# Patient Record
Sex: Male | Born: 1975
Health system: Southern US, Community
[De-identification: ages and names within clinical notes are randomized; demographics above are authoritative.]

## PROBLEM LIST (undated history)

## (undated) DIAGNOSIS — E785 Hyperlipidemia, unspecified: Secondary | ICD-10-CM

## (undated) DIAGNOSIS — Z8711 Personal history of peptic ulcer disease: Secondary | ICD-10-CM

## (undated) DIAGNOSIS — K5792 Diverticulitis of intestine, part unspecified, without perforation or abscess without bleeding: Secondary | ICD-10-CM

## (undated) DIAGNOSIS — M722 Plantar fascial fibromatosis: Secondary | ICD-10-CM

## (undated) DIAGNOSIS — Z8719 Personal history of other diseases of the digestive system: Secondary | ICD-10-CM

## (undated) HISTORY — PX: COLONOSCOPY: SHX174

## (undated) HISTORY — DX: Plantar fascial fibromatosis: M72.2

## (undated) HISTORY — PX: APPENDECTOMY: SHX54

## (undated) HISTORY — PX: UPPER GASTROINTESTINAL ENDOSCOPY: SHX188

## (undated) HISTORY — DX: Hyperlipidemia, unspecified: E78.5

## (undated) HISTORY — DX: Personal history of other diseases of the digestive system: Z87.19

## (undated) HISTORY — DX: Diverticulitis of intestine, part unspecified, without perforation or abscess without bleeding: K57.92

## (undated) HISTORY — PX: HERNIA REPAIR: SHX51

## (undated) HISTORY — DX: Personal history of peptic ulcer disease: Z87.11

---

## 2009-08-29 DIAGNOSIS — J302 Other seasonal allergic rhinitis: Secondary | ICD-10-CM | POA: Insufficient documentation

## 2009-08-29 DIAGNOSIS — Z8249 Family history of ischemic heart disease and other diseases of the circulatory system: Secondary | ICD-10-CM | POA: Insufficient documentation

## 2012-03-17 ENCOUNTER — Telehealth: Payer: Self-pay | Admitting: Internal Medicine

## 2012-03-17 ENCOUNTER — Emergency Department (HOSPITAL_COMMUNITY)
Admission: EM | Admit: 2012-03-17 | Discharge: 2012-03-17 | Disposition: A | Discharge status: Home / Self Care | Payer: BC Managed Care – PPO | Attending: Emergency Medicine | Admitting: Emergency Medicine

## 2012-03-17 ENCOUNTER — Encounter (HOSPITAL_COMMUNITY): Payer: Self-pay | Admitting: *Deleted

## 2012-03-17 DIAGNOSIS — K922 Gastrointestinal hemorrhage, unspecified: Secondary | ICD-10-CM

## 2012-03-17 DIAGNOSIS — Z79899 Other long term (current) drug therapy: Secondary | ICD-10-CM | POA: Insufficient documentation

## 2012-03-17 DIAGNOSIS — K921 Melena: Secondary | ICD-10-CM | POA: Insufficient documentation

## 2012-03-17 LAB — DIFFERENTIAL
Eosinophils Relative: 4 % (ref 0–5)
Lymphocytes Relative: 25 % (ref 12–46)
Lymphs Abs: 1.9 10*3/uL (ref 0.7–4.0)
Monocytes Absolute: 0.6 10*3/uL (ref 0.1–1.0)
Monocytes Relative: 8 % (ref 3–12)
Neutro Abs: 4.7 10*3/uL (ref 1.7–7.7)

## 2012-03-17 LAB — BASIC METABOLIC PANEL
CO2: 26 mEq/L (ref 19–32)
Chloride: 103 mEq/L (ref 96–112)
GFR calc Af Amer: >90 mL/min (ref >90)
Potassium: 4.2 mEq/L (ref 3.5–5.1)

## 2012-03-17 LAB — CBC
HCT: 36.1 % — ABNORMAL LOW (ref 39.0–52.0)
Hemoglobin: 12.4 g/dL — ABNORMAL LOW (ref 13.0–17.0)
MCV: 86.8 fL (ref 78.0–100.0)
WBC: 7.5 10*3/uL (ref 4.0–10.5)

## 2012-03-17 LAB — OCCULT BLOOD, POC DEVICE: Fecal Occult Bld: POSITIVE

## 2012-03-17 MED ORDER — SODIUM CHLORIDE 0.9 % IV SOLN
80.0000 mg | INTRAVENOUS | Status: AC
  Administered 2012-03-17: 80 mg via INTRAVENOUS
  Filled 2012-03-17: qty 80

## 2012-03-17 MED ORDER — PANTOPRAZOLE SODIUM 20 MG PO TBEC: 20.0000 mg | DELAYED_RELEASE_TABLET | Freq: Two times a day (BID) | ORAL | Status: DC

## 2012-03-17 MED ORDER — SODIUM CHLORIDE 0.9 % IV BOLUS (SEPSIS)
1000.0000 mL | INTRAVENOUS | Status: AC
  Administered 2012-03-17: 1000 mL via INTRAVENOUS

## 2012-03-17 NOTE — ED Provider Notes (Signed)
History     CSN: [id number]  Arrival date & time 03/17/12  1101   None     Chief Complaint  Patient presents with  . Rectal Bleeding    (Consider location/radiation/quality/duration/timing/severity/associated sxs/prior treatment) Patient is a 36 y.o. male presenting with hematochezia. The history is provided by the patient.  Rectal Bleeding  The current episode started yesterday. Episode frequency: twice. The problem has been unchanged. The patient is experiencing no pain. The stool is described as soft (dark appearing). There was no prior successful therapy. There was no prior unsuccessful therapy. Pertinent negatives include no fever, no abdominal pain, no diarrhea, no nausea, no rectal pain, no vomiting, no hematuria, no chest pain, no headaches, no coughing and no difficulty breathing. He has been behaving normally. He has been eating and drinking normally. Urine output has been normal. There were no sick contacts.    History reviewed. No pertinent past medical history.  Past Surgical History  Procedure Date  . Hernia repair     History reviewed. No pertinent family history.  History  Substance Use Topics  . Smoking status: Never Smoker   . Smokeless tobacco: Not on file  . Alcohol Use: 1.2 oz/week    2 Shots of liquor per week     occ      Review of Systems  Constitutional: Negative for fever.  HENT: Negative for rhinorrhea, drooling and neck pain.   Eyes: Negative for pain.  Respiratory: Negative for cough and shortness of breath.   Cardiovascular: Negative for chest pain and leg swelling.  Gastrointestinal: Positive for hematochezia. Negative for nausea, vomiting, abdominal pain, diarrhea and rectal pain.  Genitourinary: Negative for dysuria and hematuria.  Musculoskeletal: Negative for gait problem.  Skin: Negative for color change.  Neurological: Negative for numbness and headaches.  Hematological: Negative for adenopathy.  Psychiatric/Behavioral: Negative  for behavioral problems.  All other systems reviewed and are negative.    Allergies  Review of patient's allergies indicates no known allergies.  Home Medications   Current Outpatient Rx  Name Route Sig Dispense Refill  . CETIRIZINE HCL 10 MG PO TABS Oral Take 10 mg by mouth daily as needed. For allergies      BP 123/88  Pulse 91  Temp(Src) 98.1 F (36.7 C) (Oral)  Resp 18  SpO2 100%  Physical Exam  Constitutional: He is oriented to person, place, and time. He appears well-developed and well-nourished.  HENT:  Head: Normocephalic and atraumatic.  Right Ear: External ear normal.  Left Ear: External ear normal.  Nose: Nose normal.  Mouth/Throat: Oropharynx is clear and moist. No oropharyngeal exudate.  Eyes: Conjunctivae and EOM are normal. Pupils are equal, round, and reactive to light.  Neck: Normal range of motion. Neck supple.  Cardiovascular: Normal rate, regular rhythm, normal heart sounds and intact distal pulses.  Exam reveals no gallop and no friction rub.   No murmur heard. Pulmonary/Chest: Effort normal and breath sounds normal. No respiratory distress. He has no wheezes.  Abdominal: Soft. Bowel sounds are normal. He exhibits no distension. There is no tenderness.  Genitourinary: Guaiac positive stool (positive heme occult, dark melanotic stool, no gross blood).  Musculoskeletal: Normal range of motion. He exhibits no edema and no tenderness.  Neurological: He is alert and oriented to person, place, and time.  Skin: Skin is warm and dry.  Psychiatric: He has a normal mood and affect. His behavior is normal.    ED Course  Procedures (including critical care time)  Labs Reviewed  OCCULT BLOOD, POC DEVICE   No results found.   No diagnosis found.    MDM  1:19 PM 36 y.o. male pw dark stool that occurred last night and this morning. Pt also notes mild dizziness this am. Pt AFVSS here, he appears well on exam, he denies any pain. Melanotic stool on rectal  exam, no gross blood. Will get labs.  Hgb 12.4, pt denies sx. Discussed case w/ Dr. Margarette Asal of [facility]. I have scheduled an appt for him in 2 days for f/u at [facility] clinic. Will give IV protonix here and start on po protonix at home. Will give 1L IVF as well.    I have discussed the diagnosis/risks/treatment options with the patient and family and believe the pt to be eligible for discharge home to follow-up with Gunnar Fusi NP, at [facility] in 2 days. We also discussed returning to the ED immediately if new or worsening sx occur. We discussed the sx which are most concerning (e.g., further dizziness, worsening of dark stools, grossly bloody stools) that necessitate immediate return. Any new prescriptions provided to the patient are listed below.  New Prescriptions   No medications on file   Clinical Impression 1. GI bleed           Purvis Sheffield, MD 03/17/12 1714

## 2012-03-17 NOTE — Discharge Instructions (Signed)
Gastrointestinal Bleeding  Bleeding in the gastrointestinal tract comes out when you throw up (vomit) or poop. Treatment will depend on how fast the blood is flowing, where it is coming from, and the cause. A small amount of bleeding that stops on its own may not need treatment.   HOME CARE   Do not drink alcohol.   Do not eat things that upset your stomach or give you heartburn.   Rest and limit your activity.   Do not smoke. Smoking may make your problems worse.   Wash your hands or use sanitizer every time you use the bathroom. Some bleeding is caused by germs.   Only take medicine as told by your doctor.  GET HELP RIGHT AWAY IF:    Your throw up looks like coffee grounds or is dark or bright red.   Your poop is black or tarry. You see blood in the toilet.   You feel weak, dizzy, and short of breath.   You breathe fast and have a fast heartbeat.   You have bad stomach pain or cramping.  MAKE SURE YOU:    Understand these instructions.   Will watch your condition.   Will get help right away if you are not doing well or get worse.  Document Released: 07/16/2008 Document Revised: 09/26/2011 Document Reviewed: 09/16/2011  Exit[employer] Patient Information 2012 [employer].

## 2012-03-17 NOTE — ED Notes (Signed)
Pt undressed, in gown, on continuous pulse oximetry and blood pressure cuff; wife at bedisde

## 2012-03-17 NOTE — ED Notes (Signed)
Pt states after dinner started to feel different and he had some black color stools.  This am woke up feeling dizzy and lightheaded and reports blood in toilet and dark stools

## 2012-03-17 NOTE — ED Notes (Signed)
Pt on laptop and wife at bedside; pt has no needs at this time

## 2012-03-17 NOTE — Telephone Encounter (Signed)
Pt given an appt with Willette Cluster, NP on 03/19/12.

## 2012-03-17 NOTE — ED Provider Notes (Signed)
Melena since last evening feels fine otherwise.  I saw and evaluated the patient, reviewed the resident's note and I agree with the findings and plan.  Hurman Horn, MD 03/18/12 [phone]

## 2012-03-19 ENCOUNTER — Other Ambulatory Visit (INDEPENDENT_AMBULATORY_CARE_PROVIDER_SITE_OTHER): Payer: BC Managed Care – PPO

## 2012-03-19 ENCOUNTER — Ambulatory Visit (INDEPENDENT_AMBULATORY_CARE_PROVIDER_SITE_OTHER): Payer: BC Managed Care – PPO | Admitting: Nurse Practitioner

## 2012-03-19 ENCOUNTER — Encounter: Payer: Self-pay | Admitting: Nurse Practitioner

## 2012-03-19 VITALS — BP 118/82 | HR 72 | Ht 73.0 in | Wt 210.2 lb

## 2012-03-19 DIAGNOSIS — K921 Melena: Secondary | ICD-10-CM

## 2012-03-19 DIAGNOSIS — R195 Other fecal abnormalities: Secondary | ICD-10-CM

## 2012-03-19 DIAGNOSIS — K625 Hemorrhage of anus and rectum: Secondary | ICD-10-CM

## 2012-03-19 DIAGNOSIS — K922 Gastrointestinal hemorrhage, unspecified: Secondary | ICD-10-CM

## 2012-03-19 LAB — CBC WITH DIFFERENTIAL/PLATELET
Basophils Absolute: 0 10*3/uL (ref 0.0–0.1)
HCT: 32.7 % — ABNORMAL LOW (ref 39.0–52.0)
Hemoglobin: 10.9 g/dL — ABNORMAL LOW (ref 13.0–17.0)
Lymphs Abs: 1.9 10*3/uL (ref 0.7–4.0)
MCHC: 33.4 g/dL (ref 30.0–36.0)
MCV: 91.4 fl (ref 78.0–100.0)
Monocytes Absolute: 0.4 10*3/uL (ref 0.1–1.0)
Monocytes Relative: 7 % (ref 3.0–12.0)
Neutro Abs: 3.2 10*3/uL (ref 1.4–7.7)
Platelets: 230 10*3/uL (ref 150.0–400.0)
RDW: 13.4 % (ref 11.5–14.6)

## 2012-03-20 ENCOUNTER — Ambulatory Visit (AMBULATORY_SURGERY_CENTER): Payer: BC Managed Care – PPO | Admitting: Gastroenterology

## 2012-03-20 ENCOUNTER — Encounter: Payer: Self-pay | Admitting: Gastroenterology

## 2012-03-20 VITALS — BP 121/70 | HR 80 | Temp 97.7°F | Resp 18 | Ht 72.0 in | Wt 210.0 lb

## 2012-03-20 DIAGNOSIS — K250 Acute gastric ulcer with hemorrhage: Secondary | ICD-10-CM

## 2012-03-20 DIAGNOSIS — K922 Gastrointestinal hemorrhage, unspecified: Secondary | ICD-10-CM

## 2012-03-20 DIAGNOSIS — K2990 Gastroduodenitis, unspecified, without bleeding: Secondary | ICD-10-CM

## 2012-03-20 DIAGNOSIS — K625 Hemorrhage of anus and rectum: Secondary | ICD-10-CM

## 2012-03-20 DIAGNOSIS — D62 Acute posthemorrhagic anemia: Secondary | ICD-10-CM

## 2012-03-20 DIAGNOSIS — K2970 Gastritis, unspecified, without bleeding: Secondary | ICD-10-CM

## 2012-03-20 DIAGNOSIS — R195 Other fecal abnormalities: Secondary | ICD-10-CM

## 2012-03-20 MED ORDER — PANTOPRAZOLE SODIUM 20 MG PO TBEC: 20.0000 mg | DELAYED_RELEASE_TABLET | Freq: Every day | ORAL | Status: DC

## 2012-03-20 MED ORDER — SODIUM CHLORIDE 0.9 % IV SOLN: 500.0000 mL | INTRAVENOUS | Status: DC

## 2012-03-20 NOTE — Progress Notes (Signed)
Reviewed and agree with management. Samya Siciliano D. Bijan Ridgley, M.D., FACG  

## 2012-03-20 NOTE — Progress Notes (Signed)
Patient did not have preoperative order for IV antibiotic SSI prophylaxis. (G8918)  Patient did not experience any of the following events: a burn prior to discharge; a fall within the facility; wrong site/side/patient/procedure/implant event; or a hospital transfer or hospital admission upon discharge from the facility. (G8907)  

## 2012-03-20 NOTE — Progress Notes (Signed)
03/20/2012 Larry Pratt [id number] Sep 07, 1976   HISTORY OF PRESENT ILLNESS: Patient is a 36 year old male in great health here for evaluation of black stool. On Monday evening patient began to feel a little out of the ordinary though he could not pinpoint what was exactly wrong. Then he later passed a black stool. He lpassed another black stool the following day. Patient was evlauted in the ED where his BUN was mildly elevated. He was mildy anemic with hemoglobin of 12.7. Stool was heme positive. Since ED visit patient had a third melenic stool yesterday. He is slightly lightheaded at times but overall feels okay. No associated abdominal pain. No nausea. Patient doesn't take NSAIDs on a regular basis. No GERD symptoms. The ED started him on Protonix.  History reviewed. No pertinent past medical history. Past Surgical History  Procedure Date  . Hernia repair     reports that he has never smoked. He quit smokeless tobacco use about 15 years ago. His smokeless tobacco use included Chew. He reports that he drinks about 1.2 ounces of alcohol per week. He reports that he does not use illicit drugs. family history includes Cardiomyopathy in his father and Lung cancer in his paternal grandmother. No Known Allergies    Outpatient Encounter Prescriptions as of 03/19/2012  Medication Sig Dispense Refill  . cetirizine (ZYRTEC) 10 MG tablet Take 10 mg by mouth daily as needed. For allergies      . pantoprazole (PROTONIX) 20 MG tablet Take 1 tablet (20 mg total) by mouth 2 (two) times daily.  20 tablet  0     REVIEW OF SYSTEMS  : All other systems reviewed and negative except where noted in the History of Present Illness.   PHYSICAL EXAM: BP 118/82  Pulse 72  Ht 6\' 1"  (1.854 m)  Wt 210 lb 3.2 oz (95.346 kg)  BMI 27.73 kg/m2  SpO2 98% General: Well developed white male in no acute distress Head: Normocephalic and atraumatic Eyes:  sclerae anicteric,conjunctive pink. Ears: Normal auditory  acuity Neck: Supple, no masses.  Lungs: Clear throughout to auscultation Heart: Regular rate and rhythm; no murmurs heard Abdomen: Soft, non distended, nontender. No masses or hepatomegaly noted. Normal Bowel sounds Rectal: soft, black heme positive stool in vault Musculoskeletal: Symmetrical with no gross deformities  Skin: No lesions on visible extremities Extremities: No edema or deformities noted Neurological: Alert oriented , grossly nonfocal Cervical Nodes:  No significant cervical adenopathy Psychological:  Alert and cooperative. Normal mood and affect  ASSESSMENT AND PLAN:  Slow UGI bleed with melenic stool, mild anemia. Patient hemodynamically stable. Continue BID PPI. He needs EGD as soon as possible for further evaluation. His primary GI physcian will be Dr. Arlyce Dice but Dr. Russella Dar has availability to do the procedure as soon as 8:30am tomorrow. The benefits, risks, and potential complications of EGD with possible biopsies were discussed with the patient and he agrees to proceed. In the meantime if bleeding increases and/or patient becomes SOB or progressive lightheaded he will go back to ED.

## 2012-03-20 NOTE — Patient Instructions (Signed)
YOU HAD AN ENDOSCOPIC PROCEDURE TODAY AT [facility]: Refer to the procedure report that was given to you for any specific questions about what was found during the examination.  If the procedure report does not answer your questions, please call your gastroenterologist to clarify.  If you requested that your care partner not be given the details of your procedure findings, then the procedure report has been included in a sealed envelope for you to review at your convenience later.  YOU SHOULD EXPECT: Some feelings of bloating in the abdomen. Passage of more gas than usual.  Walking can help get rid of the air that was put into your GI tract during the procedure and reduce the bloating. If you had a lower endoscopy (such as a colonoscopy or flexible sigmoidoscopy) you may notice spotting of blood in your stool or on the toilet paper. If you underwent a bowel prep for your procedure, then you may not have a normal bowel movement for a few days.  DIET: Your first meal following the procedure should be a light meal and then it is ok to progress to your normal diet.  A half-sandwich or bowl of soup is an example of a good first meal.  Heavy or fried foods are harder to digest and may make you feel nauseous or bloated.  Likewise meals heavy in dairy and vegetables can cause extra gas to form and this can also increase the bloating.  Drink plenty of fluids but you should avoid alcoholic beverages for 24 hours.  ACTIVITY: Your care partner should take you home directly after the procedure.  You should plan to take it easy, moving slowly for the rest of the day.  You can resume normal activity the day after the procedure however you should NOT DRIVE or use heavy machinery for 24 hours (because of the sedation medicines used during the test).    SYMPTOMS TO REPORT IMMEDIATELY: A gastroenterologist can be reached at any hour.  During normal business hours, 8:30 AM to 5:00 PM Monday through Friday,  call [phone].  After hours and on weekends, please call the GI answering service at [phone] who will take a message and have the physician on call contact you. r  Following upper endoscopy (EGD)  Vomiting of blood or coffee ground material  New chest pain or pain under the shoulder blades  Painful or persistently difficult swallowing  New shortness of breath  Fever of 100F or higher  Black, tarry-looking stools  FOLLOW UP: If any biopsies were taken you will be contacted by phone or by letter within the next 1-3 weeks.  Call your gastroenterologist if you have not heard about the biopsies in 3 weeks.  Our staff will call the home number listed on your records the next business day following your procedure to check on you and address any questions or concerns that you may have at that time regarding the information given to you following your procedure. This is a courtesy call and so if there is no answer at the home number and we have not heard from you through the emergency physician on call, we will assume that you have returned to your regular daily activities without incident.  SIGNATURES/CONFIDENTIALITY: You and/or your care partner have signed paperwork which will be entered into your electronic medical record.  These signatures attest to the fact that that the information above on your After Visit Summary has been reviewed and is understood.  Full responsibility  of the confidentiality of this discharge information lies with you and/or your care-partner.

## 2012-03-20 NOTE — Op Note (Signed)
[facility] [address] [place], [place]  [address]  ENDOSCOPY PROCEDURE REPORT  PATIENT:  Larry Pratt, Larry Pratt  MR#:  [id number] BIRTHDATE:  August 25, 1976, 36 yrs. old  GENDER:  male ENDOSCOPIST:  Judie Petit T. Russella Dar, MD, [facility] Referred by:  Chilton Greathouse, M.D. PROCEDURE DATE:  03/20/2012 PROCEDURE:  EGD with biopsy, 43239 ASA CLASS:  Class I INDICATIONS:  melena, heme + stool, anemia MEDICATIONS:    These medications were titrated to patient response per physician's verbal order, Fentanyl 75 mcg IV, Versed 8 mg IV TOPICAL ANESTHETIC:  Cetacaine Spray DESCRIPTION OF PROCEDURE:   After the risks benefits and alternatives of the procedure were thoroughly explained, informed consent was obtained.  The LB GIF-H180 [id number] endoscope was introduced through the mouth and advanced to the second portion of the duodenum, without limitations.  The instrument was slowly withdrawn as the mucosa was fully examined. <<PROCEDUREIMAGES>> Four ulcers were found in the antrum. They were clean based and benign appearing, 5-7 mm. Multiple biopsies were obtained and sent to pathology.  Multiple erosions were found in the antrum. Otherwise normal stomach.  The esophagus and gastroesophageal junction were completely normal in appearance.  The duodenal bulb was normal in appearance, as was the postbulbar duodenum. Retroflexed views revealed no abnormalities.    The scope was then withdrawn from the patient and the procedure completed.  COMPLICATIONS:  None  ENDOSCOPIC IMPRESSION: 1) Ulcers, four in the antrum 2) Erosions, multiple in the antrum  RECOMMENDATIONS: 1) Await pathology results 2) Avoid ASA/NSAIDs 3) Call to scheule a follow-up appointment in 4-6 weeks 4) PPI bid for 4 weeks, then PPI qam 5) EGD in 8-10 weeks  Norlene Lanes T. Russella Dar, MD, [facility]  n. eSIGNED:   Venita Lick. Brendan Gadson at 03/20/2012 08:50 AM  Theodoro Kos, [id number]

## 2012-03-23 ENCOUNTER — Telehealth: Payer: Self-pay | Admitting: *Deleted

## 2012-03-23 NOTE — Telephone Encounter (Signed)
  Follow up Call-  Call back number 03/20/2012  Post procedure Call Back phone  # [phone]  Permission to leave phone message Yes     Patient questions:  Do you have a fever, pain , or abdominal swelling? no Pain Score  0 *  Have you tolerated food without any problems? yes  Have you been able to return to your normal activities? yes  Do you have any questions about your discharge instructions: Diet   no Medications  no Follow up visit  no  Do you have questions or concerns about your Care? no  Actions: * If pain score is 4 or above: No action needed, pain <4.

## 2012-03-24 ENCOUNTER — Encounter: Payer: Self-pay | Admitting: Gastroenterology

## 2012-05-04 ENCOUNTER — Ambulatory Visit: Payer: BC Managed Care – PPO | Admitting: Gastroenterology

## 2012-05-13 ENCOUNTER — Encounter: Payer: Self-pay | Admitting: Gastroenterology

## 2012-05-13 ENCOUNTER — Other Ambulatory Visit: Payer: Self-pay | Admitting: Gastroenterology

## 2012-05-13 ENCOUNTER — Other Ambulatory Visit (INDEPENDENT_AMBULATORY_CARE_PROVIDER_SITE_OTHER): Payer: BC Managed Care – PPO

## 2012-05-13 ENCOUNTER — Ambulatory Visit (INDEPENDENT_AMBULATORY_CARE_PROVIDER_SITE_OTHER): Payer: BC Managed Care – PPO | Admitting: Gastroenterology

## 2012-05-13 VITALS — BP 90/64 | HR 72 | Ht 73.0 in | Wt 208.1 lb

## 2012-05-13 DIAGNOSIS — D649 Anemia, unspecified: Secondary | ICD-10-CM

## 2012-05-13 DIAGNOSIS — D62 Acute posthemorrhagic anemia: Secondary | ICD-10-CM

## 2012-05-13 DIAGNOSIS — K253 Acute gastric ulcer without hemorrhage or perforation: Secondary | ICD-10-CM

## 2012-05-13 LAB — CBC WITH DIFFERENTIAL/PLATELET
Basophils Absolute: 0 10*3/uL (ref 0.0–0.1)
HCT: 37 % — ABNORMAL LOW (ref 39.0–52.0)
Lymphs Abs: 1.4 10*3/uL (ref 0.7–4.0)
Monocytes Absolute: 0.4 10*3/uL (ref 0.1–1.0)
Monocytes Relative: 7.9 % (ref 3.0–12.0)
Neutrophils Relative %: 60.8 % (ref 43.0–77.0)
Platelets: 271 10*3/uL (ref 150.0–400.0)
RDW: 13.4 % (ref 11.5–14.6)

## 2012-05-13 MED ORDER — PANTOPRAZOLE SODIUM 40 MG PO TBEC: 40.0000 mg | DELAYED_RELEASE_TABLET | Freq: Every day | ORAL | Status: DC

## 2012-05-13 NOTE — Progress Notes (Signed)
History of Present Illness: This is a 36 year old male who returns for followup of bleeding gastric ulcers. He states his exercise tolerance has increased and he feels it is back to normal. He has noted no evidence of bleeding, abdominal pain or any dyspeptic symptoms. Upper endoscopy performed on May 31 revealed: Ulcers, four in the antrum and erosions, multiple in the antrum. Gastric biopsies revealed chronic inactive gastritis with no evidence of H. pylori infection.  Current Medications, Allergies, Past Medical History, Past Surgical History, Family History and Social History were reviewed in [employer] record.  Physical Exam: General: Well developed , well nourished, no acute distress Head: Normocephalic and atraumatic Eyes:  sclerae anicteric, EOMI Ears: Normal auditory acuity Mouth: No deformity or lesions Lungs: Clear throughout to auscultation Heart: Regular rate and rhythm; no murmurs, rubs or bruits Abdomen: Soft, non tender and non distended. No masses, hepatosplenomegaly or hernias noted. Normal Bowel sounds Musculoskeletal: Symmetrical with no gross deformities  Pulses:  Normal pulses noted Extremities: No clubbing, cyanosis, edema or deformities noted Neurological: Alert oriented x 4, grossly nonfocal Psychological:  Alert and cooperative. Normal mood and affect  Assessment and Recommendations:  1. Bleeding gastric ulcers. Advised to minimize or avoid aspirin and NSAIDs long-term. Change pantoprazole to 40 mg daily for ulcer healing and then long-term use for ulcer prevention. 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   2. Anemia secondary to blood loss. Repeat CBC today.

## 2012-05-13 NOTE — Patient Instructions (Addendum)
Your physician has requested that you go to the basement for the following lab work before leaving today: CBC. Take 2 tablets of your pantoprazole once daily and a new prescription for 40 mg tablets have been sent to your pharmacy.  Call back to schedule your Upper Endoscopy.  cc: Chilton Greathouse, MD

## 2012-05-18 ENCOUNTER — Ambulatory Visit: Payer: BC Managed Care – PPO | Admitting: Gastroenterology

## 2012-06-15 ENCOUNTER — Telehealth: Payer: Self-pay | Admitting: Gastroenterology

## 2012-06-15 NOTE — Telephone Encounter (Signed)
I called and spoke with the patient and he is advised that he will need pre-visit.  He is scheduled for pre-visit 06/30/12

## 2012-06-30 ENCOUNTER — Encounter: Payer: Self-pay | Admitting: Gastroenterology

## 2012-06-30 ENCOUNTER — Ambulatory Visit (AMBULATORY_SURGERY_CENTER): Payer: BC Managed Care – PPO

## 2012-06-30 VITALS — Ht 73.0 in | Wt 212.7 lb

## 2012-06-30 DIAGNOSIS — Z09 Encounter for follow-up examination after completed treatment for conditions other than malignant neoplasm: Secondary | ICD-10-CM

## 2012-06-30 DIAGNOSIS — Z8719 Personal history of other diseases of the digestive system: Secondary | ICD-10-CM

## 2012-07-07 ENCOUNTER — Encounter: Payer: Self-pay | Admitting: Gastroenterology

## 2012-07-07 ENCOUNTER — Other Ambulatory Visit (INDEPENDENT_AMBULATORY_CARE_PROVIDER_SITE_OTHER): Payer: BC Managed Care – PPO

## 2012-07-07 ENCOUNTER — Ambulatory Visit (AMBULATORY_SURGERY_CENTER): Payer: BC Managed Care – PPO | Admitting: Gastroenterology

## 2012-07-07 VITALS — BP 125/85 | HR 58 | Temp 96.8°F | Resp 24 | Ht 73.0 in | Wt 208.0 lb

## 2012-07-07 DIAGNOSIS — D490 Neoplasm of unspecified behavior of digestive system: Secondary | ICD-10-CM

## 2012-07-07 DIAGNOSIS — Z8719 Personal history of other diseases of the digestive system: Secondary | ICD-10-CM

## 2012-07-07 DIAGNOSIS — K259 Gastric ulcer, unspecified as acute or chronic, without hemorrhage or perforation: Secondary | ICD-10-CM

## 2012-07-07 DIAGNOSIS — D130 Benign neoplasm of esophagus: Secondary | ICD-10-CM

## 2012-07-07 DIAGNOSIS — D649 Anemia, unspecified: Secondary | ICD-10-CM

## 2012-07-07 DIAGNOSIS — Z09 Encounter for follow-up examination after completed treatment for conditions other than malignant neoplasm: Secondary | ICD-10-CM

## 2012-07-07 LAB — CBC WITH DIFFERENTIAL/PLATELET
Eosinophils Relative: 3.3 % (ref 0.0–5.0)
HCT: 43.6 % (ref 39.0–52.0)
Monocytes Relative: 7.8 % (ref 3.0–12.0)
Neutrophils Relative %: 59.8 % (ref 43.0–77.0)
Platelets: 252 10*3/uL (ref 150.0–400.0)
RBC: 5.23 Mil/uL (ref 4.22–5.81)
WBC: 6.3 10*3/uL (ref 4.5–10.5)

## 2012-07-07 MED ORDER — PANTOPRAZOLE SODIUM 40 MG PO TBEC: 40.0000 mg | DELAYED_RELEASE_TABLET | Freq: Every day | ORAL | Status: DC

## 2012-07-07 MED ORDER — SODIUM CHLORIDE 0.9 % IV SOLN: 500.0000 mL | INTRAVENOUS | Status: DC

## 2012-07-07 NOTE — Progress Notes (Signed)
Patient did not experience any of the following events: a burn prior to discharge; a fall within the facility; wrong site/side/patient/procedure/implant event; or a hospital transfer or hospital admission upon discharge from the facility. (G8907) Patient did not have preoperative order for IV antibiotic SSI prophylaxis. (G8918)  

## 2012-07-07 NOTE — Progress Notes (Signed)
The pt tolerated the egd very well. Maw   

## 2012-07-07 NOTE — Op Note (Signed)
[facility] [address] [place], [address]   ENDOSCOPY PROCEDURE REPORT  PATIENT: Larry Pratt, Larry Pratt  MR#: [id number] BIRTHDATE: 1975/11/03 , 36  yrs. old GENDER: Male ENDOSCOPIST: Meryl Dare, MD, [facility] REFERRED BY:  Chilton Greathouse, M.D. PROCEDURE DATE:  07/07/2012 PROCEDURE:  EGD w/ biopsy ASA CLASS:     Class II INDICATIONS:  Follow up of bleeding gastric ulcers. MEDICATIONS: MAC sedation, administered by CRNA and propofol (Diprivan) 220mg  IV TOPICAL ANESTHETIC: Cetacaine Spray  DESCRIPTION OF PROCEDURE: After the risks benefits and alternatives of the procedure were thoroughly explained, informed consent was obtained.  The LB GIF-H180 [id number] endoscope was introduced through the mouth and advanced to the second portion of the duodenum. Without limitations.  The instrument was slowly withdrawn as the mucosa was fully examined.   ESOPHAGUS: A sessile nodule measuring 3 mm in size was found in the esophagus at 35 cm from incisors.  A biopsy was obtained that completely removed the nodule. The esophagus was otherwise normal.  STOMACH: Small erosion and patchy erythema was found in the gastric antrum. All ulcers had healed. The stomach otherwise appeared normal. DUODENUM: The duodenal mucosa showed no abnormalities in the bulb and second portion of the duodenum.  Retroflexed views revealed no abnormalities.     The scope was then withdrawn from the patient and the procedure completed.  COMPLICATIONS: There were no complications. ENDOSCOPIC IMPRESSION: 1.   Esophageal nodule 2.   Erosion and erythema in the gastric antrum  RECOMMENDATIONS: 1.  Await pathology results 2.  Avoid/minimize ASA/NSAIDs 3.  Continue PPI: pantoprazole 40 mg po qam with refills for 1 year   eSigned:  Meryl Dare, MD, [facility] 07/07/2012 2:43 PM   OZ:[id number] Avva, MD

## 2012-07-07 NOTE — Patient Instructions (Addendum)

## 2012-07-08 ENCOUNTER — Telehealth: Payer: Self-pay

## 2012-07-08 NOTE — Telephone Encounter (Signed)
I left a message on the pt's answering machine to please call us back if he has any questions or concerns. Maw

## 2012-07-13 ENCOUNTER — Encounter: Payer: Self-pay | Admitting: Gastroenterology

## 2012-11-19 DIAGNOSIS — E785 Hyperlipidemia, unspecified: Secondary | ICD-10-CM | POA: Insufficient documentation

## 2012-11-19 DIAGNOSIS — K279 Peptic ulcer, site unspecified, unspecified as acute or chronic, without hemorrhage or perforation: Secondary | ICD-10-CM | POA: Insufficient documentation

## 2015-12-22 DIAGNOSIS — Z0000 Encounter for general adult medical examination without abnormal findings: Secondary | ICD-10-CM | POA: Insufficient documentation

## 2015-12-29 DIAGNOSIS — M722 Plantar fascial fibromatosis: Secondary | ICD-10-CM | POA: Insufficient documentation

## 2016-03-14 ENCOUNTER — Ambulatory Visit (INDEPENDENT_AMBULATORY_CARE_PROVIDER_SITE_OTHER): Payer: 59

## 2016-03-14 ENCOUNTER — Encounter: Payer: Self-pay | Admitting: Podiatry

## 2016-03-14 ENCOUNTER — Ambulatory Visit (INDEPENDENT_AMBULATORY_CARE_PROVIDER_SITE_OTHER): Payer: 59 | Admitting: Podiatry

## 2016-03-14 VITALS — BP 118/82 | HR 64 | Resp 16

## 2016-03-14 DIAGNOSIS — M722 Plantar fascial fibromatosis: Secondary | ICD-10-CM

## 2016-03-14 MED ORDER — METHYLPREDNISOLONE 4 MG PO TBPK: ORAL_TABLET | ORAL | Status: DC

## 2016-03-14 NOTE — Progress Notes (Signed)
   Subjective:    Patient ID: Larry Pratt, male    DOB: 02/02/1976, 40 y.o.   MRN: [id number]  HPI: He presents today with chief complaint of pain to the right heel. He states that he has had pain to the heel for approximately 1 year. He also had pain to the left foot and he performed stretching exercises provided by his primary care provider which alleviated the pain to the left foot. 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 He does relate hiking boots felt much better.    Review of Systems  All other systems reviewed and are negative.      Objective:   Physical Exam: Vital signs are stable alert and oriented 3. Pulses are palpable. Very pleasant well-nourished we'll spoken individual in no apparent distress. Vital signs are stable alert and oriented 3 pulses are palpable. Neurologic sensorium is intact per Semmes-Weinstein monofilament. Deep tendon reflexes are intact bilateral and muscle strength is normal bilateral. Orthopedic evaluation and straight on palpation medial calcaneal tubercle of the right heel no pain on palpation of the left heel. Radiographs do demonstrate soft tissue increase in density at the plantar fascia calcaneal insertion site of the right heel. Cutaneous evaluation demonstrates supple well-hydrated cutis no open lesions rashes.          Assessment & Plan:  Assessment: Fasciitis right.  Plan: I injected the right heel today with Kenalog and local anesthetic started him on a Medrol Dosepak. He is unable to take nonsteroidals due to a history of gastric ulceration. Put him in a plantar fascia brace in a splint. Discussed appropriate shoe gear stretching exercises ice therapy and shoe gear modification. Follow up with him in 1 month. May need to consider orthotics at that time.

## 2016-03-14 NOTE — Patient Instructions (Signed)
Plantar Fasciitis Plantar fasciitis is a painful foot condition that affects the heel. It occurs when the band of tissue that connects the toes to the heel bone (plantar fascia) becomes irritated. This can happen after exercising too much or doing other repetitive activities (overuse injury). The pain from plantar fasciitis can range from mild irritation to severe pain that makes it difficult for you to walk or move. The pain is usually worse in the morning or after you have been sitting or lying down for a while. CAUSES This condition may be caused by:  Standing for long periods of time.  Wearing shoes that do not fit.  Doing high-impact activities, including running, aerobics, and ballet.  Being overweight.  Having an abnormal way of walking (gait).  Having tight calf muscles.  Having high arches in your feet.  Starting a new athletic activity. SYMPTOMS The main symptom of this condition is heel pain. Other symptoms include:  Pain that gets worse after activity or exercise.  Pain that is worse in the morning or after resting.  Pain that goes away after you walk for a few minutes. DIAGNOSIS This condition may be diagnosed based on your signs and symptoms. Your health care provider will also do a physical exam to check for:  A tender area on the bottom of your foot.  A high arch in your foot.  Pain when you move your foot.  Difficulty moving your foot. You may also need to have imaging studies to confirm the diagnosis. These can include:  X-rays.  Ultrasound.  MRI. TREATMENT  Treatment for plantar fasciitis depends on the severity of the condition. Your treatment may include:  Rest, ice, and over-the-counter pain medicines to manage your pain.  Exercises to stretch your calves and your plantar fascia.  A splint that holds your foot in a stretched, upward position while you sleep (night splint).  Physical therapy to relieve symptoms and prevent problems in the  future.  Cortisone injections to relieve severe pain.  Extracorporeal shock wave therapy (ESWT) to stimulate damaged plantar fascia with electrical impulses. It is often used as a last resort before surgery.  Surgery, if other treatments have not worked after 12 months. HOME CARE INSTRUCTIONS  Take medicines only as directed by your health care provider.  Avoid activities that cause pain.  Roll the bottom of your foot over a bag of ice or a bottle of cold water. Do this for 20 minutes, 3-4 times a day.  Perform simple stretches as directed by your health care provider.  Try wearing athletic shoes with air-sole or gel-sole cushions or soft shoe inserts.  Wear a night splint while sleeping, if directed by your health care provider.  Keep all follow-up appointments with your health care provider. PREVENTION   Do not perform exercises or activities that cause heel pain.  Consider finding low-impact activities if you continue to have problems.  Lose weight if you need to. The best way to prevent plantar fasciitis is to avoid the activities that aggravate your plantar fascia. SEEK MEDICAL CARE IF:  Your symptoms do not go away after treatment with home care measures.  Your pain gets worse.  Your pain affects your ability to move or do your daily activities.   This information is not intended to replace advice given to you by your health care provider. Make sure you discuss any questions you have with your health care provider.   Document Released: 07/02/2001 Document Revised: 06/28/2015 Document Reviewed: 08/17/2014 Elsevier   Interactive Patient Education 2016 [employer]  

## 2016-04-11 ENCOUNTER — Encounter: Payer: Self-pay | Admitting: Podiatry

## 2016-04-11 ENCOUNTER — Ambulatory Visit (INDEPENDENT_AMBULATORY_CARE_PROVIDER_SITE_OTHER): Payer: 59 | Admitting: Podiatry

## 2016-04-11 DIAGNOSIS — M722 Plantar fascial fibromatosis: Secondary | ICD-10-CM | POA: Diagnosis not present

## 2016-04-11 MED ORDER — DICLOFENAC SODIUM 1 % TD GEL: 4.0000 g | Freq: Four times a day (QID) | TRANSDERMAL | Status: DC

## 2016-04-11 NOTE — Progress Notes (Signed)
Larry Pratt presents today with a chief complaint of a painful right heel. This is a follow-up visit regarding his plantar fasciitis right foot. He states that he was doing very well until he went to the beach and was playing with his son in the sand. He states that he was approximately 100% well however he is now approximately 50% improved.  Objective: Vital signs are stable he is alert and oriented 3 sterile saline palpation medial trochanter of the right heel. Pulses remain strong and palpable.  Assessment: Plantar fasciitis 50% resolved.  Plan: I recommended that he continue all conservative therapies and I reinjected his right heel today we may need to consider orthotics upon next visit.

## 2016-05-09 ENCOUNTER — Encounter: Payer: Self-pay | Admitting: Podiatry

## 2016-05-09 ENCOUNTER — Ambulatory Visit (INDEPENDENT_AMBULATORY_CARE_PROVIDER_SITE_OTHER): Payer: 59 | Admitting: Podiatry

## 2016-05-09 DIAGNOSIS — M722 Plantar fascial fibromatosis: Secondary | ICD-10-CM

## 2016-05-11 NOTE — Progress Notes (Signed)
She presents today for follow-up of his plantar fasciitis. He states it is really sore today. Left heel doing fine with the right heel is problematic.  Objective: Vital signs are stable he is alert and oriented 3. Pulses are palpable.  Operation mucogingival right.  Assessment: Chronic intractable plantar fasciitis right.  Plan: Injected the right heel today. We'll consider orthotics next visit.

## 2016-06-06 ENCOUNTER — Encounter: Payer: Self-pay | Admitting: Podiatry

## 2016-06-06 ENCOUNTER — Ambulatory Visit (INDEPENDENT_AMBULATORY_CARE_PROVIDER_SITE_OTHER): Payer: 59 | Admitting: Podiatry

## 2016-06-06 DIAGNOSIS — M722 Plantar fascial fibromatosis: Secondary | ICD-10-CM

## 2016-06-06 NOTE — Progress Notes (Signed)
He presents today for follow-up of plantar fasciitis of his right foot. He states that he is approximately 80% improved at this point.  Objective: Vital signs are stable alert and oriented 3. Pulses are palpable. Very little tenderness on palpation medial calcaneal tubercle of the right heel. Pulses are palpable.  Assessment: Fasciitis right foot.  Plan: I recommended that he consider orthotics. He would like to wait on the orthotics and start back to his tennis this fall. Should he redevelop fasciitis that he will agree to orthotics and will follow up with him at that time.

## 2016-08-01 ENCOUNTER — Ambulatory Visit (INDEPENDENT_AMBULATORY_CARE_PROVIDER_SITE_OTHER): Payer: 59 | Admitting: Podiatry

## 2016-08-01 ENCOUNTER — Encounter: Payer: Self-pay | Admitting: Podiatry

## 2016-08-01 DIAGNOSIS — M722 Plantar fascial fibromatosis: Secondary | ICD-10-CM

## 2016-08-01 NOTE — Progress Notes (Signed)
He presents today with a chief concern of recalcitrant plantar fasciitis right foot. He states that for the most part most days demonstrated very little pain with approximately an 80% resolution. He states however there are other days where he feels he hurts worse than he did prior to treatment. He continues to utilize a Clif[place] gel.  Objective: Vital signs are stable he is alert and oriented 3. Pulses are strongly palpable.  Palpation medial calcaneal tubercle of the right heel and to some degree the Achilles as well. He also has some tenderness on palpation medial calcaneal tubercle of the left heel.  Assessment: Chronic intractable plantar fasciitis right.  Plan: At this point we are going to get him into a pair orthotics were also going to order a second pair for his dress shoes. I am considering referral to physical therapy should his orthotics not alleviate his symptoms. We may need an injection prior to dispensing the orthotics.

## 2016-08-26 ENCOUNTER — Ambulatory Visit (INDEPENDENT_AMBULATORY_CARE_PROVIDER_SITE_OTHER): Payer: 59

## 2016-08-26 DIAGNOSIS — M722 Plantar fascial fibromatosis: Secondary | ICD-10-CM

## 2016-08-29 ENCOUNTER — Encounter: Payer: 59 | Admitting: Podiatry

## 2016-08-29 NOTE — Patient Instructions (Signed)

## 2016-08-29 NOTE — Progress Notes (Signed)
Patient presents for orthotic pick up.  Verbal and written break in and wear instructions given.  Patient will follow up in 4 weeks if symptoms worsen or fail to improve. 

## 2016-09-24 ENCOUNTER — Ambulatory Visit (INDEPENDENT_AMBULATORY_CARE_PROVIDER_SITE_OTHER): Payer: 59 | Admitting: Podiatry

## 2016-09-24 ENCOUNTER — Encounter: Payer: Self-pay | Admitting: Podiatry

## 2016-09-24 DIAGNOSIS — M722 Plantar fascial fibromatosis: Secondary | ICD-10-CM | POA: Diagnosis not present

## 2016-09-25 NOTE — Progress Notes (Signed)
He presents today for follow-up of his plantar fasciitis right heel. He states that is doing much better than it has in years. He loves his orthotics.  Objective: Vital signs are stable he's alert and oriented 3. Pulses are palpable. He has no reproducible pain on palpation medial calcaneal tubercles bilateral.  Assessment: Well-healing plantar fasciitis  Plan: He will continue use of the orthotics. Notify me with questions or concerns.

## 2016-11-28 DIAGNOSIS — Z0000 Encounter for general adult medical examination without abnormal findings: Secondary | ICD-10-CM | POA: Diagnosis not present

## 2016-11-28 DIAGNOSIS — R8299 Other abnormal findings in urine: Secondary | ICD-10-CM | POA: Diagnosis not present

## 2016-11-28 DIAGNOSIS — Z125 Encounter for screening for malignant neoplasm of prostate: Secondary | ICD-10-CM | POA: Diagnosis not present

## 2016-12-05 DIAGNOSIS — Z1389 Encounter for screening for other disorder: Secondary | ICD-10-CM | POA: Diagnosis not present

## 2016-12-05 DIAGNOSIS — Z0000 Encounter for general adult medical examination without abnormal findings: Secondary | ICD-10-CM | POA: Diagnosis not present

## 2016-12-05 DIAGNOSIS — K279 Peptic ulcer, site unspecified, unspecified as acute or chronic, without hemorrhage or perforation: Secondary | ICD-10-CM | POA: Diagnosis not present

## 2016-12-05 DIAGNOSIS — J302 Other seasonal allergic rhinitis: Secondary | ICD-10-CM | POA: Diagnosis not present

## 2016-12-05 DIAGNOSIS — E784 Other hyperlipidemia: Secondary | ICD-10-CM | POA: Diagnosis not present

## 2016-12-26 DIAGNOSIS — D229 Melanocytic nevi, unspecified: Secondary | ICD-10-CM | POA: Insufficient documentation

## 2017-01-02 DIAGNOSIS — J028 Acute pharyngitis due to other specified organisms: Secondary | ICD-10-CM | POA: Diagnosis not present

## 2017-01-04 DIAGNOSIS — H1033 Unspecified acute conjunctivitis, bilateral: Secondary | ICD-10-CM | POA: Diagnosis not present

## 2017-01-09 DIAGNOSIS — H1089 Other conjunctivitis: Secondary | ICD-10-CM | POA: Diagnosis not present

## 2017-02-10 DIAGNOSIS — D225 Melanocytic nevi of trunk: Secondary | ICD-10-CM | POA: Diagnosis not present

## 2017-02-10 DIAGNOSIS — D485 Neoplasm of uncertain behavior of skin: Secondary | ICD-10-CM | POA: Diagnosis not present

## 2017-02-10 DIAGNOSIS — D1801 Hemangioma of skin and subcutaneous tissue: Secondary | ICD-10-CM | POA: Diagnosis not present

## 2017-02-20 DIAGNOSIS — L720 Epidermal cyst: Secondary | ICD-10-CM | POA: Diagnosis not present

## 2017-02-20 DIAGNOSIS — L723 Sebaceous cyst: Secondary | ICD-10-CM | POA: Diagnosis not present

## 2017-07-21 DIAGNOSIS — J029 Acute pharyngitis, unspecified: Secondary | ICD-10-CM | POA: Diagnosis not present

## 2017-08-14 DIAGNOSIS — Z23 Encounter for immunization: Secondary | ICD-10-CM | POA: Diagnosis not present

## 2017-11-28 DIAGNOSIS — Z0000 Encounter for general adult medical examination without abnormal findings: Secondary | ICD-10-CM | POA: Diagnosis not present

## 2017-11-28 DIAGNOSIS — Z125 Encounter for screening for malignant neoplasm of prostate: Secondary | ICD-10-CM | POA: Diagnosis not present

## 2017-11-28 DIAGNOSIS — R82998 Other abnormal findings in urine: Secondary | ICD-10-CM | POA: Diagnosis not present

## 2017-12-08 DIAGNOSIS — Z1389 Encounter for screening for other disorder: Secondary | ICD-10-CM | POA: Diagnosis not present

## 2017-12-08 DIAGNOSIS — E7849 Other hyperlipidemia: Secondary | ICD-10-CM | POA: Diagnosis not present

## 2017-12-08 DIAGNOSIS — K279 Peptic ulcer, site unspecified, unspecified as acute or chronic, without hemorrhage or perforation: Secondary | ICD-10-CM | POA: Diagnosis not present

## 2017-12-08 DIAGNOSIS — Z8249 Family history of ischemic heart disease and other diseases of the circulatory system: Secondary | ICD-10-CM | POA: Diagnosis not present

## 2017-12-08 DIAGNOSIS — Z0000 Encounter for general adult medical examination without abnormal findings: Secondary | ICD-10-CM | POA: Diagnosis not present

## 2017-12-11 DIAGNOSIS — Z1212 Encounter for screening for malignant neoplasm of rectum: Secondary | ICD-10-CM | POA: Diagnosis not present

## 2018-08-24 DIAGNOSIS — Z23 Encounter for immunization: Secondary | ICD-10-CM | POA: Diagnosis not present

## 2018-12-04 DIAGNOSIS — Z125 Encounter for screening for malignant neoplasm of prostate: Secondary | ICD-10-CM | POA: Diagnosis not present

## 2018-12-04 DIAGNOSIS — R82998 Other abnormal findings in urine: Secondary | ICD-10-CM | POA: Diagnosis not present

## 2018-12-04 DIAGNOSIS — Z0000 Encounter for general adult medical examination without abnormal findings: Secondary | ICD-10-CM | POA: Diagnosis not present

## 2018-12-14 DIAGNOSIS — Z0000 Encounter for general adult medical examination without abnormal findings: Secondary | ICD-10-CM | POA: Diagnosis not present

## 2018-12-14 DIAGNOSIS — M25552 Pain in left hip: Secondary | ICD-10-CM | POA: Insufficient documentation

## 2019-08-30 ENCOUNTER — Other Ambulatory Visit: Payer: Self-pay

## 2019-08-30 DIAGNOSIS — Z20822 Contact with and (suspected) exposure to covid-19: Secondary | ICD-10-CM

## 2019-08-31 LAB — NOVEL CORONAVIRUS, NAA: SARS-CoV-2, NAA: NOT DETECTED

## 2020-03-31 ENCOUNTER — Ambulatory Visit (HOSPITAL_COMMUNITY)
Admission: EM | Admit: 2020-03-31 | Discharge: 2020-03-31 | Disposition: A | Discharge status: Home / Self Care | Payer: 59 | Attending: Emergency Medicine | Admitting: Emergency Medicine

## 2020-03-31 ENCOUNTER — Emergency Department (HOSPITAL_COMMUNITY): Payer: 59

## 2020-03-31 ENCOUNTER — Encounter (HOSPITAL_COMMUNITY): Payer: Self-pay

## 2020-03-31 ENCOUNTER — Emergency Department (HOSPITAL_COMMUNITY): Payer: 59 | Admitting: Anesthesiology

## 2020-03-31 ENCOUNTER — Other Ambulatory Visit: Payer: Self-pay

## 2020-03-31 ENCOUNTER — Encounter (HOSPITAL_COMMUNITY)
Admission: EM | Disposition: A | Discharge status: Home / Self Care | Payer: Self-pay | Source: Home / Self Care | Attending: Emergency Medicine

## 2020-03-31 DIAGNOSIS — K269 Duodenal ulcer, unspecified as acute or chronic, without hemorrhage or perforation: Secondary | ICD-10-CM | POA: Insufficient documentation

## 2020-03-31 DIAGNOSIS — K222 Esophageal obstruction: Secondary | ICD-10-CM | POA: Diagnosis not present

## 2020-03-31 DIAGNOSIS — K3189 Other diseases of stomach and duodenum: Secondary | ICD-10-CM

## 2020-03-31 DIAGNOSIS — K228 Other specified diseases of esophagus: Secondary | ICD-10-CM | POA: Insufficient documentation

## 2020-03-31 DIAGNOSIS — Z20822 Contact with and (suspected) exposure to covid-19: Secondary | ICD-10-CM | POA: Diagnosis not present

## 2020-03-31 DIAGNOSIS — X58XXXA Exposure to other specified factors, initial encounter: Secondary | ICD-10-CM | POA: Diagnosis not present

## 2020-03-31 DIAGNOSIS — R1310 Dysphagia, unspecified: Secondary | ICD-10-CM | POA: Insufficient documentation

## 2020-03-31 DIAGNOSIS — Z79899 Other long term (current) drug therapy: Secondary | ICD-10-CM | POA: Insufficient documentation

## 2020-03-31 DIAGNOSIS — T18128A Food in esophagus causing other injury, initial encounter: Secondary | ICD-10-CM | POA: Diagnosis present

## 2020-03-31 HISTORY — PX: ESOPHAGOGASTRODUODENOSCOPY (EGD) WITH PROPOFOL: SHX5813

## 2020-03-31 HISTORY — PX: BIOPSY: SHX5522

## 2020-03-31 HISTORY — PX: IMPACTION REMOVAL: SHX5858

## 2020-03-31 LAB — I-STAT CHEM 8, ED
BUN: 19 mg/dL (ref 6–20)
Calcium, Ion: 0.98 mmol/L — ABNORMAL LOW (ref 1.15–1.40)
Chloride: 105 mmol/L (ref 98–111)
Creatinine, Ser: 0.8 mg/dL (ref 0.61–1.24)
Glucose, Bld: 95 mg/dL (ref 70–99)
HCT: 47 % (ref 39.0–52.0)
Hemoglobin: 16 g/dL (ref 13.0–17.0)
Potassium: 4.7 mmol/L (ref 3.5–5.1)
Sodium: 138 mmol/L (ref 135–145)
TCO2: 24 mmol/L (ref 22–32)

## 2020-03-31 LAB — SARS CORONAVIRUS 2 BY RT PCR (HOSPITAL ORDER, PERFORMED IN ~~LOC~~ HOSPITAL LAB): SARS Coronavirus 2: NEGATIVE

## 2020-03-31 LAB — CBC WITH DIFFERENTIAL/PLATELET
Abs Immature Granulocytes: 0.02 10*3/uL (ref 0.00–0.07)
Basophils Absolute: 0.1 10*3/uL (ref 0.0–0.1)
Basophils Relative: 1 %
Eosinophils Absolute: 0.4 10*3/uL (ref 0.0–0.5)
Eosinophils Relative: 3 %
HCT: 51.1 % (ref 39.0–52.0)
Hemoglobin: 17 g/dL (ref 13.0–17.0)
Immature Granulocytes: 0 %
Lymphocytes Relative: 14 %
Lymphs Abs: 1.7 10*3/uL (ref 0.7–4.0)
MCH: 30.7 pg (ref 26.0–34.0)
MCHC: 33.3 g/dL (ref 30.0–36.0)
MCV: 92.2 fL (ref 80.0–100.0)
Monocytes Absolute: 0.8 10*3/uL (ref 0.1–1.0)
Monocytes Relative: 7 %
Neutro Abs: 9 10*3/uL — ABNORMAL HIGH (ref 1.7–7.7)
Neutrophils Relative %: 75 %
Platelets: 283 10*3/uL (ref 150–400)
RBC: 5.54 MIL/uL (ref 4.22–5.81)
RDW: 12.2 % (ref 11.5–15.5)
WBC: 12 10*3/uL — ABNORMAL HIGH (ref 4.0–10.5)
nRBC: 0 % (ref 0.0–0.2)

## 2020-03-31 LAB — I-STAT BETA HCG BLOOD, ED (MC, WL, AP ONLY): I-stat hCG, quantitative: <5 m[IU]/mL (ref <5)

## 2020-03-31 SURGERY — ESOPHAGOGASTRODUODENOSCOPY (EGD) WITH PROPOFOL
Anesthesia: General

## 2020-03-31 MED ORDER — GLUCAGON HCL RDNA (DIAGNOSTIC) 1 MG IJ SOLR
2.0000 mg | Freq: Once | INTRAMUSCULAR | Status: AC
  Administered 2020-03-31: 2 mg via INTRAVENOUS
  Filled 2020-03-31: qty 2

## 2020-03-31 MED ORDER — PROPOFOL 10 MG/ML IV BOLUS
INTRAVENOUS | Status: DC | PRN
  Administered 2020-03-31: 200 mg via INTRAVENOUS

## 2020-03-31 MED ORDER — FENTANYL CITRATE (PF) 250 MCG/5ML IJ SOLN
INTRAMUSCULAR | Status: DC | PRN
  Administered 2020-03-31 (×2): 50 ug via INTRAVENOUS

## 2020-03-31 MED ORDER — LACTATED RINGERS IV SOLN
INTRAVENOUS | Status: DC | PRN
Start: 2020-03-31 — End: 2020-03-31

## 2020-03-31 MED ORDER — SUCCINYLCHOLINE CHLORIDE 20 MG/ML IJ SOLN
INTRAMUSCULAR | Status: DC | PRN
  Administered 2020-03-31: 100 mg via INTRAVENOUS

## 2020-03-31 MED ORDER — DEXMEDETOMIDINE HCL 200 MCG/2ML IV SOLN
INTRAVENOUS | Status: DC | PRN
  Administered 2020-03-31 (×2): 8 ug via INTRAVENOUS

## 2020-03-31 MED ORDER — MIDAZOLAM HCL 5 MG/5ML IJ SOLN
INTRAMUSCULAR | Status: DC | PRN
  Administered 2020-03-31: 2 mg via INTRAVENOUS

## 2020-03-31 MED ORDER — LIDOCAINE 2% (20 MG/ML) 5 ML SYRINGE
INTRAMUSCULAR | Status: DC | PRN
  Administered 2020-03-31: 100 mg via INTRAVENOUS

## 2020-03-31 MED ORDER — SODIUM CHLORIDE 0.9 % IV SOLN: INTRAVENOUS | Status: DC

## 2020-03-31 MED ORDER — PHENYLEPHRINE 40 MCG/ML (10ML) SYRINGE FOR IV PUSH (FOR BLOOD PRESSURE SUPPORT)
PREFILLED_SYRINGE | INTRAVENOUS | Status: DC | PRN
  Administered 2020-03-31: 80 ug via INTRAVENOUS
  Administered 2020-03-31: 60 ug via INTRAVENOUS

## 2020-03-31 MED ORDER — ONDANSETRON HCL 4 MG/2ML IJ SOLN
INTRAMUSCULAR | Status: DC | PRN
  Administered 2020-03-31: 4 mg via INTRAVENOUS

## 2020-03-31 SURGICAL SUPPLY — 14 items

## 2020-03-31 NOTE — ED Notes (Signed)
Called [facility] to Dr Beatris Si

## 2020-03-31 NOTE — Anesthesia Preprocedure Evaluation (Signed)
Anesthesia Evaluation  Patient identified by MRN, date of birth, ID band Patient awake    Reviewed: Allergy & Precautions, NPO status , Patient's Chart, lab work & pertinent test results  Airway Mallampati: II  TM Distance: >3 FB Neck ROM: Full    Dental no notable dental hx. (+) Teeth Intact, Dental Advisory Given   Pulmonary neg pulmonary ROS,    Pulmonary exam normal breath sounds clear to auscultation       Cardiovascular negative cardio ROS Normal cardiovascular exam Rhythm:Regular Rate:Normal     Neuro/Psych negative neurological ROS  negative psych ROS   GI/Hepatic PUD,   Endo/Other  negative endocrine ROS  Renal/GU negative Renal ROS     Musculoskeletal negative musculoskeletal ROS (+)   Abdominal   Peds  Hematology negative hematology ROS (+)   Anesthesia Other Findings   Reproductive/Obstetrics negative OB ROS                             Anesthesia Physical Anesthesia Plan  ASA: I and emergent  Anesthesia Plan: General   Post-op Pain Management:    Induction: Intravenous, Cricoid pressure planned and Rapid sequence  PONV Risk Score and Plan: 3 and Treatment may vary due to age or medical condition, Ondansetron and Dexamethasone  Airway Management Planned: Oral ETT  Additional Equipment:   Intra-op Plan:   Post-operative Plan: Extubation in OR  Informed Consent: I have reviewed the patients History and Physical, chart, labs and discussed the procedure including the risks, benefits and alternatives for the proposed anesthesia with the patient or authorized representative who has indicated his/her understanding and acceptance.     Dental advisory given  Plan Discussed with: CRNA  Anesthesia Plan Comments: (Emergent Food bolus GA w Ett)        Anesthesia Quick Evaluation

## 2020-03-31 NOTE — Discharge Instructions (Signed)
YOU HAD AN ENDOSCOPIC PROCEDURE TODAY: Refer to the procedure report and other information in the discharge instructions given to you for any specific questions about what was found during the examination. If this information does not answer your questions, please call [place] office at [phone] to clarify.   YOU SHOULD EXPECT: Some feelings of bloating in the abdomen. Passage of more gas than usual. Walking can help get rid of the air that was put into your GI tract during the procedure and reduce the bloating. Some abdominal soreness may be present for a day or two, also.  DIET: Your first meal following the procedure should be a light meal and then it is ok to progress to your normal diet. A half-sandwich or bowl of soup is an example of a good first meal. Heavy or fried foods are harder to digest and may make you feel nauseous or bloated. Drink plenty of fluids but you should avoid alcoholic beverages for 24 hours. If you had a esophageal dilation, please see attached instructions for diet.    ACTIVITY: Your care partner should take you home directly after the procedure. You should plan to take it easy, moving slowly for the rest of the day. You can resume normal activity the day after the procedure however YOU SHOULD NOT DRIVE, use power tools, machinery or perform tasks that involve climbing or major physical exertion for 24 hours (because of the sedation medicines used during the test).   SYMPTOMS TO REPORT IMMEDIATELY: A gastroenterologist can be reached at any hour. Please call [phone]  for any of the following symptoms:   Following upper endoscopy (EGD, EUS, ERCP, esophageal dilation) Vomiting of blood or coffee ground material  New, significant abdominal pain  New, significant chest pain or pain under the shoulder blades  Painful or persistently difficult swallowing  New shortness of breath  Black, tarry-looking or red, bloody stools  FOLLOW UP:  If any biopsies were taken you  will be contacted by phone or by letter within the next 1-3 weeks. Call [phone]  if you have not heard about the biopsies in 3 weeks.  Please also call with any specific questions about appointments or follow up tests.

## 2020-03-31 NOTE — ED Triage Notes (Signed)
Per pt he was at home tonight and eating and could not swallow his food. Pt said it felt hung in his throat. Pt said he could not swallow water. Pt said he went to bed and woke up with salava all over his pillow and cant swallow it down Air way stable at this time

## 2020-03-31 NOTE — H&P (Signed)
History    Chief Complaint  Patient presents with  . Dysphagia    Larry Pratt is a 44 y.o. male with intermittent solid food dysphagia and acute dysphagia eating meat last evening around 6pm. Unable to handle secretions since.   The history is provided by the patient.  Foreign Body Location:  Swallowed Suspected object:  Food (mac and cheese and pork tenderloin) Pain quality:  Tightness (foreign body sensation) Pain severity:  Mild Duration:  12 hours Timing:  Constant Progression:  Unchanged Chronicity:  New Worsened by:  Nothing Ineffective treatments:  None tried Associated symptoms: drooling   Associated symptoms: no abdominal pain and no ear pain   Associated symptoms comment:  Unable to tolerate liquids and saliva and woke up in a pool of his own saliva.   Risk factors: no developmental delay   Patient with a h/o ulcer presents with esophageal food impaction.         Past Medical History:  Diagnosis Date  . History of stomach ulcers         Patient Active Problem List   Diagnosis Date Noted  . Gastrointestinal hemorrhage 03/20/2012         Past Surgical History:  Procedure Laterality Date  . APPENDECTOMY    . HERNIA REPAIR    . UPPER GASTROINTESTINAL ENDOSCOPY                        Family History  Problem Relation Age of Onset  . Cardiomyopathy Father   . Lung cancer Paternal Grandmother   . Uterine cancer Mother     Social History        Tobacco Use  . Smoking status: Never Smoker  . Smokeless tobacco: Never Used  Substance Use Topics  . Alcohol use: Yes    Alcohol/week: 2.0 standard drinks    Types: 2 Shots of liquor per week    Comment: occ  . Drug use: No    Home Medications        Prior to Admission medications   Medication Sig Start Date End Date Taking? Authorizing Provider  diclofenac sodium (VOLTAREN) 1 % GEL Apply 4 g topically 4 (four) times daily. Patient not taking: Reported on  03/31/2020 04/11/16   Hyatt, Max T, DPM  pantoprazole (PROTONIX) 40 MG tablet Take 1 tablet (40 mg total) by mouth daily. Patient not taking: Reported on 03/31/2020 07/07/12   Ladene Artist, MD    Allergies            Patient has no known allergies.  Review of Systems   Review of Systems  Constitutional: Negative for fever.  HENT: Positive for drooling. Negative for ear pain.   Eyes: Negative for visual disturbance.  Respiratory: Negative for shortness of breath.   Cardiovascular: Negative for chest pain.  Gastrointestinal: Negative for abdominal pain.  Genitourinary: Negative for difficulty urinating.  Musculoskeletal: Negative for gait problem.  Skin: Negative for color change.  Neurological: Negative for dizziness.  Psychiatric/Behavioral: Negative for agitation.  All other systems reviewed and are negative.   Physical Exam Updated Vital Signs BP (!) 150/104   Pulse 72   Temp 98.4 F (36.9 C) (Oral)   Resp (!) 22   SpO2 97%   Physical Exam Vitals and nursing note reviewed.  Constitutional:      General: He is not in acute distress.    Appearance: Normal appearance.  HENT:     Head: Normocephalic and atraumatic.  Nose: Nose normal.     Mouth/Throat:     Mouth: Mucous membranes are moist.     Pharynx: Oropharynx is clear. No oropharyngeal exudate.  Eyes:     Conjunctiva/sclera: Conjunctivae normal.     Pupils: Pupils are equal, round, and reactive to light.  Cardiovascular:     Rate and Rhythm: Normal rate and regular rhythm.     Pulses: Normal pulses.     Heart sounds: Normal heart sounds.  Pulmonary:     Effort: Pulmonary effort is normal.     Breath sounds: Normal breath sounds.  Abdominal:     General: Abdomen is flat. Bowel sounds are normal.     Tenderness: There is no abdominal tenderness. There is no guarding or rebound.  Musculoskeletal:        General: Normal range of motion.     Cervical back: Normal range of motion and neck supple.   Skin:    General: Skin is warm and dry.     Capillary Refill: Capillary refill takes less than 2 seconds.  Neurological:     General: No focal deficit present.     Mental Status: He is alert and oriented to person, place, and time.     Deep Tendon Reflexes: Reflexes normal.  Psychiatric:        Mood and Affect: Mood normal.        Behavior: Behavior normal.     ED Results / Procedures / Treatments   Labs (all labs ordered are listed, but only abnormal results are displayed)      Results for orders placed or performed during the hospital encounter of 03/31/20  SARS Coronavirus 2 by RT PCR (hospital order, performed in [place] hospital lab) Nasopharyngeal Nasopharyngeal Swab   Specimen: Nasopharyngeal Swab  Result Value Ref Range   SARS Coronavirus 2 NEGATIVE NEGATIVE  CBC with Differential/Platelet  Result Value Ref Range   WBC 12.0 (H) 4.0 - 10.5 K/uL   RBC 5.54 4.22 - 5.81 MIL/uL   Hemoglobin 17.0 13.0 - 17.0 g/dL   HCT 51.1 39 - 52 %   MCV 92.2 80.0 - 100.0 fL   MCH 30.7 26.0 - 34.0 pg   MCHC 33.3 30.0 - 36.0 g/dL   RDW 12.2 11.5 - 15.5 %   Platelets 283 150 - 400 K/uL   nRBC 0.0 0.0 - 0.2 %   Neutrophils Relative % 75 %   Neutro Abs 9.0 (H) 1.7 - 7.7 K/uL   Lymphocytes Relative 14 %   Lymphs Abs 1.7 0.7 - 4.0 K/uL   Monocytes Relative 7 %   Monocytes Absolute 0.8 0 - 1 K/uL   Eosinophils Relative 3 %   Eosinophils Absolute 0.4 0 - 0 K/uL   Basophils Relative 1 %   Basophils Absolute 0.1 0 - 0 K/uL   Immature Granulocytes 0 %   Abs Immature Granulocytes 0.02 0.00 - 0.07 K/uL  I-stat chem 8, ED (not at [facility] or [facility])  Result Value Ref Range   Sodium 138 135 - 145 mmol/L   Potassium 4.7 3.5 - 5.1 mmol/L   Chloride 105 98 - 111 mmol/L   BUN 19 6 - 20 mg/dL   Creatinine, Ser 0.80 0.61 - 1.24 mg/dL   Glucose, Bld 95 70 - 99 mg/dL   Calcium, Ion 0.98 (L) 1.15 - 1.40 mmol/L   TCO2 24 22 - 32 mmol/L   Hemoglobin 16.0 13.0 -  17.0 g/dL   HCT 47.0 39 - 52 %  I-Stat beta hCG blood, ED ([facility], [facility], [facility] only)  Result Value Ref Range   I-stat hCG, quantitative <5.0 <5 mIU/mL   Comment 3            Imaging Results  DG Neck Soft Tissue  Result Date: 03/31/2020 CLINICAL DATA:  Unable to swallow food, globus sensation EXAM: NECK SOFT TISSUES - 1+ VIEW COMPARISON:  None. FINDINGS: There is no evidence of retropharyngeal soft tissue swelling or gas. The epiglottis remains sharp and well-defined. Aryepiglottic folds are unremarkable. The cervical airway is unremarkable. No radio-opaque foreign body identified. Minimal cervical spondylitic changes without large anterior spurring. No acute abnormality in the upper chest or imaged lung apices. IMPRESSION: 1. No evidence of retropharyngeal soft tissue swelling or gas. No radiopaque foreign body. 2. Minimal cervical spondylitic changes. Electronically Signed   By: Lovena Le M.D.   On: 03/31/2020 04:42     EKG None  Radiology  Imaging Results (Last 48 hours)  DG Neck Soft Tissue  Result Date: 03/31/2020 CLINICAL DATA:  Unable to swallow food, globus sensation EXAM: NECK SOFT TISSUES - 1+ VIEW COMPARISON:  None. FINDINGS: There is no evidence of retropharyngeal soft tissue swelling or gas. The epiglottis remains sharp and well-defined. Aryepiglottic folds are unremarkable. The cervical airway is unremarkable. No radio-opaque foreign body identified. Minimal cervical spondylitic changes without large anterior spurring. No acute abnormality in the upper chest or imaged lung apices. IMPRESSION: 1. No evidence of retropharyngeal soft tissue swelling or gas. No radiopaque foreign body. 2. Minimal cervical spondylitic changes. Electronically Signed   By: Lovena Le M.D.   On: 03/31/2020 04:42     Procedures Procedures (including critical care time)  Medications Ordered in ED Medications  0.9 %  sodium chloride infusion ( Intravenous New Bag/Given 03/31/20 0413)   glucagon (human recombinant) (GLUCAGEN) injection 2 mg (2 mg Intravenous Given 03/31/20 0413)    ED Course  I have reviewed the triage vital signs and the nursing notes.  Pertinent labs & imaging results that were available during my care of the patient were reviewed by me and considered in my medical decision making (see chart for details).  Attempted glucagon followed by sprite and jumping up and down and liquid returned.    Patient is NPO.  I have started IVF and patient is resting anticipating GI.  610 case d/w Dr. Henrene Pastor.  AM team to see the patient for dissempaction of food bolus .   Final Clinical Impression(s) / ED Diagnoses  Final diagnoses:  Esophageal obstruction due to food impaction   EGD and definitive management of food bolus. Infrequent intermittent solid dysphagia. Acute food impaction since 6pm yesterday. Suspected esophageal stricture. The risks (including bleeding, perforation, infection, missed lesions, medication reactions and possible hospitalization or surgery if complications occur), benefits, and alternatives to endoscopy with possible biopsy and possible dilation were discussed with the patient and they consent to proceed.

## 2020-03-31 NOTE — Op Note (Signed)
[facility] Patient Name: Larry Pratt Procedure Date : 03/31/2020 MRN: [id number] Attending MD: Ladene Artist , MD Date of Birth: 1976/07/23 CSN: [id number] Age: 44 Admit Type: Emergency Department Procedure:                Upper GI endoscopy Indications:              Foreign body in the esophagus, Dysphagia, Food                            impaction Providers:                Pricilla Riffle. Fuller Plan, MD, Burtis Junes, RN, Laverda Sorenson,                            Technician, Virgilio Belling. Huel Cote, CRNA Referring MD:             Prince Solian MD Medicines:                General Anesthesia Complications:            No immediate complications. Estimated Blood Loss:     Estimated blood loss was minimal. Procedure:                Pre-Anesthesia Assessment:                           - Prior to the procedure, a History and Physical                            was performed, and patient medications and                            allergies were reviewed. The patient's tolerance of                            previous anesthesia was also reviewed. The risks                            and benefits of the procedure and the sedation                            options and risks were discussed with the patient.                            All questions were answered, and informed consent                            was obtained. Prior Anticoagulants: The patient has                            taken no previous anticoagulant or antiplatelet                            agents. ASA Grade Assessment: II - A patient with  mild systemic disease. After reviewing the risks                            and benefits, the patient was deemed in                            satisfactory condition to undergo the procedure.                           After obtaining informed consent, the endoscope was                            passed under direct vision. Throughout the                             procedure, the patient's blood pressure, pulse, and                            oxygen saturations were monitored continuously. The                            GIF-H190 ([id number]) Olympus gastroscope was                            introduced through the mouth, and advanced to the                            second part of duodenum. The upper GI endoscopy was                            accomplished without difficulty. The patient                            tolerated the procedure well. Scope In: Scope Out: Findings:      Food was found in the mid esophagus and in the distal esophagus. 10 cm       of stringy meat. Removal of food was accomplished with a tripod grasper       until the lumen was viewed and then the remaining meat was gently       advanced into the stomach.      Two benign-appearing, intrinsic moderate stenoses were found 32 cm and       40 cm from the incisors. The narrowest stenosis at 32 cm measured 1.2 cm       (inner diameter) x less than one cm (in length). The stenoses were       traversed.      Patchy moderate erythema was found in the mid esophagus and in the       distal esophagus. Several areas with linear furrows noted. Biopsies were       taken with a cold forceps for histology.      The exam of the esophagus was otherwise normal.      Patchy mildly erythematous mucosa without bleeding was found in the       prepyloric region of the stomach.      The exam of the stomach was  otherwise normal.      Multiple diffuse erosions without bleeding were found in the duodenal       bulb.      The exam of the duodenum was otherwise normal. Impression:               - Food in the mid esophagus and in the distal                            esophagus. Removal was successful.                           - Benign-appearing esophageal stenoses.                           - Erythema in the mid esophagus and in the distal                            esophagus. Linear furrows in  the esophagus.                            Biopsied.                           - Erythematous mucosa in the prepyloric region of                            the stomach.                           - Duodenal erosions without bleeding. Recommendation:           - Clear liquid diet for 2-3 hours, then advance                            slowly as tolerated to soft diet today.                           - Protonix (pantoprazole) 40 mg PO daily.                           - Return to GI for EGD with dilation at appointment                            to be scheduled.                           - Await pathology results. Procedure Code(s):        --- Professional ---                           [phone], Esophagogastroduodenoscopy, flexible,                            transoral; with removal of foreign body(s)                           43239, Esophagogastroduodenoscopy,  flexible,                            transoral; with biopsy, single or multiple Diagnosis Code(s):        --- Professional ---                           [phone], Food in esophagus causing other injury,                            initial encounter                           K22.2, Esophageal obstruction                           K22.8, Other specified diseases of esophagus                           K31.89, Other diseases of stomach and duodenum                           K26.9, Duodenal ulcer, unspecified as acute or                            chronic, without hemorrhage or perforation                           T18.108A, Unspecified foreign body in esophagus                            causing other injury, initial encounter CPT copyright 2019 [employer] Medical Association. All rights reserved. The codes documented in this report are preliminary and upon coder review may  be revised to meet current compliance requirements. Ladene Artist, MD 03/31/2020 9:04:50 AM This report has been signed electronically. Number of Addenda: 0

## 2020-03-31 NOTE — ED Provider Notes (Signed)
Patient returns from endoscopy lab, awake alert and oriented x4. Patient is tolerating small sips of clear fluid. Patient has discharge instructions with prescription postprocedure, plan to follow-up with [facility]. Return to ER as needed.   Tacy Learn, PA-C 03/31/20 1022    Dorie Rank, MD 04/01/20 [phone]

## 2020-03-31 NOTE — Transfer of Care (Signed)
Immediate Anesthesia Transfer of Care Note  Patient: Cord Wilczynski  Procedure(s) Performed: ESOPHAGOGASTRODUODENOSCOPY (EGD) WITH PROPOFOL (N/A ) IMPACTION REMOVAL BIOPSY  Patient Location: PACU  Anesthesia Type:General  Level of Consciousness: awake, alert  and oriented  Airway & Oxygen Therapy: Patient Spontanous Breathing  Post-op Assessment: Report given to RN, Post -op Vital signs reviewed and stable and Patient moving all extremities X 4  Post vital signs: Reviewed and stable  Last Vitals:  Vitals Value Taken Time  BP 111/70 03/31/20 0905  Temp 37.1 C 03/31/20 0854  Pulse 86 03/31/20 0910  Resp 15 03/31/20 0910  SpO2 96 % 03/31/20 0910  Vitals shown include unvalidated device data.  Last Pain:  Vitals:   03/31/20 0905  TempSrc:   PainSc: 0-No pain         Complications: No complications documented.

## 2020-03-31 NOTE — ED Provider Notes (Signed)
[facility] EMERGENCY DEPARTMENT Provider Note   CSN: [id number] Arrival date & time: 03/31/20  0151     History Chief Complaint  Patient presents with  . Dysphagia    Larry Pratt is a 44 y.o. male.  The history is provided by the patient.  Foreign Body Location:  Swallowed Suspected object:  Food (mac and cheese and pork tenderloin) Pain quality:  Tightness (foreign body sensation) Pain severity:  Mild Duration:  12 hours Timing:  Constant Progression:  Unchanged Chronicity:  New Worsened by:  Nothing Ineffective treatments:  None tried Associated symptoms: drooling   Associated symptoms: no abdominal pain and no ear pain   Associated symptoms comment:  Unable to tolerate liquids and saliva and woke up in a pool of his own saliva.   Risk factors: no developmental delay   Patient with a h/o ulcer presents with esophageal food impaction.       Past Medical History:  Diagnosis Date  . History of stomach ulcers     Patient Active Problem List   Diagnosis Date Noted  . Gastrointestinal hemorrhage 03/20/2012    Past Surgical History:  Procedure Laterality Date  . APPENDECTOMY    . HERNIA REPAIR    . UPPER GASTROINTESTINAL ENDOSCOPY         Family History  Problem Relation Age of Onset  . Cardiomyopathy Father   . Lung cancer Paternal Grandmother   . Uterine cancer Mother     Social History   Tobacco Use  . Smoking status: Never Smoker  . Smokeless tobacco: Never Used  Substance Use Topics  . Alcohol use: Yes    Alcohol/week: 2.0 standard drinks    Types: 2 Shots of liquor per week    Comment: occ  . Drug use: No    Home Medications Prior to Admission medications   Medication Sig Start Date End Date Taking? Authorizing Provider  diclofenac sodium (VOLTAREN) 1 % GEL Apply 4 g topically 4 (four) times daily. Patient not taking: Reported on 03/31/2020 04/11/16   Hyatt, Max T, DPM  pantoprazole (PROTONIX) 40 MG tablet  Take 1 tablet (40 mg total) by mouth daily. Patient not taking: Reported on 03/31/2020 07/07/12   Ladene Artist, MD    Allergies    Patient has no known allergies.  Review of Systems   Review of Systems  Constitutional: Negative for fever.  HENT: Positive for drooling. Negative for ear pain.   Eyes: Negative for visual disturbance.  Respiratory: Negative for shortness of breath.   Cardiovascular: Negative for chest pain.  Gastrointestinal: Negative for abdominal pain.  Genitourinary: Negative for difficulty urinating.  Musculoskeletal: Negative for gait problem.  Skin: Negative for color change.  Neurological: Negative for dizziness.  Psychiatric/Behavioral: Negative for agitation.  All other systems reviewed and are negative.   Physical Exam Updated Vital Signs BP (!) 150/104   Pulse 72   Temp 98.4 F (36.9 C) (Oral)   Resp (!) 22   SpO2 97%   Physical Exam Vitals and nursing note reviewed.  Constitutional:      General: He is not in acute distress.    Appearance: Normal appearance.  HENT:     Head: Normocephalic and atraumatic.     Nose: Nose normal.     Mouth/Throat:     Mouth: Mucous membranes are moist.     Pharynx: Oropharynx is clear. No oropharyngeal exudate.  Eyes:     Conjunctiva/sclera: Conjunctivae normal.     Pupils:  Pupils are equal, round, and reactive to light.  Cardiovascular:     Rate and Rhythm: Normal rate and regular rhythm.     Pulses: Normal pulses.     Heart sounds: Normal heart sounds.  Pulmonary:     Effort: Pulmonary effort is normal.     Breath sounds: Normal breath sounds.  Abdominal:     General: Abdomen is flat. Bowel sounds are normal.     Tenderness: There is no abdominal tenderness. There is no guarding or rebound.  Musculoskeletal:        General: Normal range of motion.     Cervical back: Normal range of motion and neck supple.  Skin:    General: Skin is warm and dry.     Capillary Refill: Capillary refill takes less  than 2 seconds.  Neurological:     General: No focal deficit present.     Mental Status: He is alert and oriented to person, place, and time.     Deep Tendon Reflexes: Reflexes normal.  Psychiatric:        Mood and Affect: Mood normal.        Behavior: Behavior normal.     ED Results / Procedures / Treatments   Labs (all labs ordered are listed, but only abnormal results are displayed) Results for orders placed or performed during the hospital encounter of 03/31/20  SARS Coronavirus 2 by RT PCR (hospital order, performed in [place] hospital lab) Nasopharyngeal Nasopharyngeal Swab   Specimen: Nasopharyngeal Swab  Result Value Ref Range   SARS Coronavirus 2 NEGATIVE NEGATIVE  CBC with Differential/Platelet  Result Value Ref Range   WBC 12.0 (H) 4.0 - 10.5 K/uL   RBC 5.54 4.22 - 5.81 MIL/uL   Hemoglobin 17.0 13.0 - 17.0 g/dL   HCT 51.1 39 - 52 %   MCV 92.2 80.0 - 100.0 fL   MCH 30.7 26.0 - 34.0 pg   MCHC 33.3 30.0 - 36.0 g/dL   RDW 12.2 11.5 - 15.5 %   Platelets 283 150 - 400 K/uL   nRBC 0.0 0.0 - 0.2 %   Neutrophils Relative % 75 %   Neutro Abs 9.0 (H) 1.7 - 7.7 K/uL   Lymphocytes Relative 14 %   Lymphs Abs 1.7 0.7 - 4.0 K/uL   Monocytes Relative 7 %   Monocytes Absolute 0.8 0 - 1 K/uL   Eosinophils Relative 3 %   Eosinophils Absolute 0.4 0 - 0 K/uL   Basophils Relative 1 %   Basophils Absolute 0.1 0 - 0 K/uL   Immature Granulocytes 0 %   Abs Immature Granulocytes 0.02 0.00 - 0.07 K/uL  I-stat chem 8, ED (not at [facility] or [facility])  Result Value Ref Range   Sodium 138 135 - 145 mmol/L   Potassium 4.7 3.5 - 5.1 mmol/L   Chloride 105 98 - 111 mmol/L   BUN 19 6 - 20 mg/dL   Creatinine, Ser 0.80 0.61 - 1.24 mg/dL   Glucose, Bld 95 70 - 99 mg/dL   Calcium, Ion 0.98 (L) 1.15 - 1.40 mmol/L   TCO2 24 22 - 32 mmol/L   Hemoglobin 16.0 13.0 - 17.0 g/dL   HCT 47.0 39 - 52 %  I-Stat beta hCG blood, ED ([facility], [facility], [facility] only)  Result Value Ref Range   I-stat hCG, quantitative <5.0 <5  mIU/mL   Comment 3           DG Neck Soft Tissue  Result Date: 03/31/2020 CLINICAL  DATA:  Unable to swallow food, globus sensation EXAM: NECK SOFT TISSUES - 1+ VIEW COMPARISON:  None. FINDINGS: There is no evidence of retropharyngeal soft tissue swelling or gas. The epiglottis remains sharp and well-defined. Aryepiglottic folds are unremarkable. The cervical airway is unremarkable. No radio-opaque foreign body identified. Minimal cervical spondylitic changes without large anterior spurring. No acute abnormality in the upper chest or imaged lung apices. IMPRESSION: 1. No evidence of retropharyngeal soft tissue swelling or gas. No radiopaque foreign body. 2. Minimal cervical spondylitic changes. Electronically Signed   By: Lovena Le M.D.   On: 03/31/2020 04:42    EKG None  Radiology DG Neck Soft Tissue  Result Date: 03/31/2020 CLINICAL DATA:  Unable to swallow food, globus sensation EXAM: NECK SOFT TISSUES - 1+ VIEW COMPARISON:  None. FINDINGS: There is no evidence of retropharyngeal soft tissue swelling or gas. The epiglottis remains sharp and well-defined. Aryepiglottic folds are unremarkable. The cervical airway is unremarkable. No radio-opaque foreign body identified. Minimal cervical spondylitic changes without large anterior spurring. No acute abnormality in the upper chest or imaged lung apices. IMPRESSION: 1. No evidence of retropharyngeal soft tissue swelling or gas. No radiopaque foreign body. 2. Minimal cervical spondylitic changes. Electronically Signed   By: Lovena Le M.D.   On: 03/31/2020 04:42    Procedures Procedures (including critical care time)  Medications Ordered in ED Medications  0.9 %  sodium chloride infusion ( Intravenous New Bag/Given 03/31/20 0413)  glucagon (human recombinant) (GLUCAGEN) injection 2 mg (2 mg Intravenous Given 03/31/20 0413)    ED Course  I have reviewed the triage vital signs and the nursing notes.  Pertinent labs & imaging results that  were available during my care of the patient were reviewed by me and considered in my medical decision making (see chart for details).    Attempted glucagon followed by sprite and jumping up and down and liquid returned.    Patient is NPO.  I have started IVF and patient is resting anticipating GI.  610 case d/w Dr. Henrene Pastor.  AM team to see the patient for dissempaction of food bolus .   Final Clinical Impression(s) / ED Diagnoses Final diagnoses:  Esophageal obstruction due to food impaction   To endoscopy for definitive management of food bolus.     Nakiah Osgood, MD 03/31/20 [phone]

## 2020-03-31 NOTE — Anesthesia Procedure Notes (Signed)
Procedure Name: Intubation Date/Time: 03/31/2020 7:59 AM Performed by: Mariea Clonts, CRNA Pre-anesthesia Checklist: Patient identified, Emergency Drugs available, Suction available and Patient being monitored Patient Re-evaluated:Patient Re-evaluated prior to induction Oxygen Delivery Method: Circle System Utilized Preoxygenation: Pre-oxygenation with 100% oxygen Induction Type: IV induction, Cricoid Pressure applied and Rapid sequence Laryngoscope Size: Miller and 2 Grade View: Grade I Tube type: Oral Tube size: 7.5 mm Number of attempts: 1 Airway Equipment and Method: Stylet and Oral airway Placement Confirmation: ETT inserted through vocal cords under direct vision,  positive ETCO2 and breath sounds checked- equal and bilateral Tube secured with: Tape Dental Injury: Teeth and Oropharynx as per pre-operative assessment

## 2020-03-31 NOTE — Anesthesia Postprocedure Evaluation (Signed)
Anesthesia Post Note  Patient: Larry Pratt  Procedure(s) Performed: ESOPHAGOGASTRODUODENOSCOPY (EGD) WITH PROPOFOL (N/A ) IMPACTION REMOVAL BIOPSY     Patient location during evaluation: Endoscopy Anesthesia Type: General Level of consciousness: awake and alert Pain management: pain level controlled Vital Signs Assessment: post-procedure vital signs reviewed and stable Respiratory status: spontaneous breathing, nonlabored ventilation, respiratory function stable and patient connected to nasal cannula oxygen Cardiovascular status: blood pressure returned to baseline and stable Postop Assessment: no apparent nausea or vomiting Anesthetic complications: no   No complications documented.  Last Vitals:  Vitals:   03/31/20 1023 03/31/20 1038  BP:  (!) 140/101  Pulse:    Resp: 13   Temp:  37.1 C  SpO2:      Last Pain:  Vitals:   03/31/20 1038  TempSrc:   PainSc: 0-No pain                 Barnet Glasgow

## 2020-04-02 ENCOUNTER — Encounter (HOSPITAL_COMMUNITY): Payer: Self-pay | Admitting: Gastroenterology

## 2020-04-03 ENCOUNTER — Telehealth: Payer: Self-pay | Admitting: Gastroenterology

## 2020-04-03 NOTE — Telephone Encounter (Signed)
Dr. Fuller Plan had performed an EGD in the hospital on Larry Pratt due to food impaction.  Larry Pratt stated that he was advised to schedule an esophageal dilation in two weeks.  Is this a direct procedure in [place] or does Larry Pratt need to have a follow up OV first?

## 2020-04-04 ENCOUNTER — Telehealth: Payer: Self-pay | Admitting: Gastroenterology

## 2020-04-04 DIAGNOSIS — K259 Gastric ulcer, unspecified as acute or chronic, without hemorrhage or perforation: Secondary | ICD-10-CM

## 2020-04-04 DIAGNOSIS — T18128A Food in esophagus causing other injury, initial encounter: Secondary | ICD-10-CM

## 2020-04-04 LAB — SURGICAL PATHOLOGY

## 2020-04-04 MED ORDER — PANTOPRAZOLE SODIUM 40 MG PO TBEC: 40.0000 mg | DELAYED_RELEASE_TABLET | Freq: Every day | ORAL | 3 refills | Status: DC

## 2020-04-04 NOTE — Telephone Encounter (Signed)
Prescription sent to patient's pharmacy.

## 2020-04-04 NOTE — Telephone Encounter (Signed)
Pt saw Dr. Fuller Plan in the hospital and stated that he would be prescribed pantoprazole as per discharge instructions.  Pt requested rx to be sent to [facility].

## 2020-04-04 NOTE — Telephone Encounter (Signed)
Can be a direct EGD with dilation and pre-visit in the [facility]

## 2020-04-25 ENCOUNTER — Other Ambulatory Visit: Payer: Self-pay

## 2020-04-25 ENCOUNTER — Ambulatory Visit (AMBULATORY_SURGERY_CENTER): Payer: Self-pay | Admitting: *Deleted

## 2020-04-25 VITALS — Ht 73.0 in | Wt 222.0 lb

## 2020-04-25 DIAGNOSIS — R1319 Other dysphagia: Secondary | ICD-10-CM

## 2020-04-25 NOTE — Progress Notes (Signed)

## 2020-04-27 ENCOUNTER — Encounter: Payer: Self-pay | Admitting: Gastroenterology

## 2020-05-01 ENCOUNTER — Ambulatory Visit (AMBULATORY_SURGERY_CENTER): Payer: 59 | Admitting: Gastroenterology

## 2020-05-01 ENCOUNTER — Other Ambulatory Visit: Payer: Self-pay

## 2020-05-01 ENCOUNTER — Encounter: Payer: Self-pay | Admitting: Gastroenterology

## 2020-05-01 VITALS — BP 123/84 | HR 58 | Temp 97.1°F | Resp 19 | Ht 73.0 in | Wt 222.0 lb

## 2020-05-01 DIAGNOSIS — K2970 Gastritis, unspecified, without bleeding: Secondary | ICD-10-CM | POA: Diagnosis not present

## 2020-05-01 DIAGNOSIS — R1310 Dysphagia, unspecified: Secondary | ICD-10-CM

## 2020-05-01 DIAGNOSIS — K222 Esophageal obstruction: Secondary | ICD-10-CM | POA: Diagnosis not present

## 2020-05-01 DIAGNOSIS — K2950 Unspecified chronic gastritis without bleeding: Secondary | ICD-10-CM | POA: Diagnosis not present

## 2020-05-01 MED ORDER — SODIUM CHLORIDE 0.9 % IV SOLN: 500.0000 mL | Freq: Once | INTRAVENOUS | Status: DC

## 2020-05-01 MED ORDER — FLOVENT HFA 220 MCG/ACT IN AERO: INHALATION_SPRAY | RESPIRATORY_TRACT | 0 refills | Status: DC

## 2020-05-01 NOTE — Progress Notes (Signed)
Called to room to assist during endoscopic procedure.  Patient ID and intended procedure confirmed with present staff. Received instructions for my participation in the procedure from the performing physician.  

## 2020-05-01 NOTE — Progress Notes (Signed)
Pt's states no medical or surgical changes since previsit or office visit.   V/s-cw  Check-in-jb 

## 2020-05-01 NOTE — Progress Notes (Signed)
Report given to PACU, vss 

## 2020-05-01 NOTE — Progress Notes (Signed)
Robinul 0.1 mg IV given due large amount of secretions upon assessment.  MD made aware, vss 

## 2020-05-01 NOTE — Patient Instructions (Signed)
NO NSAIDS or ALCOHOL until your office visit in 6 weeks.  Read all of the handouts given to you by your recovery room nurse.  Thank-you for choosing [place]  for your healthcare needs today.  Take your new medication as ordered.  YOU HAD AN ENDOSCOPIC PROCEDURE TODAY AT [facility]:   Refer to the procedure report that was given to you for any specific questions about what was found during the examination.  If the procedure report does not answer your questions, please call your gastroenterologist to clarify.  If you requested that your care partner not be given the details of your procedure findings, then the procedure report has been included in a sealed envelope for you to review at your convenience later.  YOU SHOULD EXPECT: Some feelings of bloating in the abdomen. Passage of more gas than usual.  Walking can help get rid of the air that was put into your GI tract during the procedure and reduce the bloating. If you had a lower endoscopy (such as a colonoscopy or flexible sigmoidoscopy) you may notice spotting of blood in your stool or on the toilet paper. If you underwent a bowel prep for your procedure, you may not have a normal bowel movement for a few days.  Please Note:  You might notice some irritation and congestion in your nose or some drainage.  This is from the oxygen used during your procedure.  There is no need for concern and it should clear up in a day or so.  SYMPTOMS TO REPORT IMMEDIATELY:    Following upper endoscopy (EGD)  Vomiting of blood or coffee ground material  New chest pain or pain under the shoulder blades  Painful or persistently difficult swallowing  New shortness of breath  Fever of 100F or higher  Black, tarry-looking stools  For urgent or emergent issues, a gastroenterologist can be reached at any hour by calling [phone]. Do not use MyChart messaging for urgent concerns.    DIET:  We do recommend clear liquids until 1130 am.  A soft  diet for the rest of the day is required.  You may have a regular diet tomorrw..  ACTIVITY:  You should plan to take it easy for the rest of today and you should NOT DRIVE or use heavy machinery until tomorrow (because of the sedation medicines used during the test).    FOLLOW UP: Our staff will call the number listed on your records 48-72 hours following your procedure to check on you and address any questions or concerns that you may have regarding the information given to you following your procedure. If we do not reach you, we will leave a message.  We will attempt to reach you two times.  During this call, we will ask if you have developed any symptoms of COVID 19. If you develop any symptoms (ie: fever, flu-like symptoms, shortness of breath, cough etc.) before then, please call [phone].  If you test positive for Covid 19 in the 2 weeks post procedure, please call and report this information to [place].    If any biopsies were taken you will be contacted by phone or by letter within the next 1-3 weeks.  Please call us at [phone] if you have not heard about the biopsies in 3 weeks.    SIGNATURES/CONFIDENTIALITY: You and/or your care partner have signed paperwork which will be entered into your electronic medical record.  These signatures attest to the fact that that the information  above on your After Visit Summary has been reviewed and is understood.  Full responsibility of the confidentiality of this discharge information lies with you and/or your care-partner. 

## 2020-05-01 NOTE — Op Note (Signed)
[place] [facility] Patient Name: Larry Pratt Procedure Date: 05/01/2020 9:24 AM MRN: [id number] Endoscopist: Ladene Artist , MD Age: 44 Referring MD:  Date of Birth: 01-15-1976 Gender: Male Account #: [id number] Procedure:                Upper GI endoscopy Indications:              Therapeutic procedure for esophageal stricture                            dilation, Dysphagia Medicines:                Monitored Anesthesia Care Procedure:                Pre-Anesthesia Assessment:                           - Prior to the procedure, a History and Physical                            was performed, and patient medications and                            allergies were reviewed. The patient's tolerance of                            previous anesthesia was also reviewed. The risks                            and benefits of the procedure and the sedation                            options and risks were discussed with the patient.                            All questions were answered, and informed consent                            was obtained. Prior Anticoagulants: The patient has                            taken no previous anticoagulant or antiplatelet                            agents. ASA Grade Assessment: II - A patient with                            mild systemic disease. After reviewing the risks                            and benefits, the patient was deemed in                            satisfactory condition to undergo the procedure.  After obtaining informed consent, the endoscope was                            passed under direct vision. Throughout the                            procedure, the patient's blood pressure, pulse, and                            oxygen saturations were monitored continuously. The                            Endoscope was introduced through the mouth, and                            advanced to the second part of duodenum.  The upper                            GI endoscopy was accomplished without difficulty.                            The patient tolerated the procedure well. Scope In: Scope Out: Findings:                 Mucosal changes including longitudinal furrows,                            white plaques and punctate white spots were found                            in the entire esophagus.                           Two benign-appearing, intrinsic mild stenoses were                            found 32 cm and 40 cm from the incisors. The                            narrowest stenosis measured 1.3 cm (inner                            diameter). The stenoses were traversed. A guidewire                            was placed and the scope was withdrawn. Dilation                            was performed with a Savary dilator with no                            resistance at 15 mm. Dilations were performed with  Savary dilators with mild resistance at 16 mm and                            17 mm. No heme on dilators.                           A few localized medium erosions with no bleeding                            and no stigmata of recent bleeding were found in                            the prepyloric region of the stomach. Biopsies were                            taken with a cold forceps for histology.                           The exam of the stomach was otherwise normal.                           The duodenal bulb and second portion of the                            duodenum were normal. Complications:            No immediate complications. Estimated Blood Loss:     Estimated blood loss was minimal. Impression:               - Esophageal mucosal changes consistent with                            eosinophilic esophagitis.                           - Benign-appearing esophageal stenoses. Dilated.                           - Erosive gastropathy with no bleeding and no                             stigmata of recent bleeding. Biopsied.                           - Normal duodenal bulb and second portion of the                            duodenum. Recommendation:           - Patient has a contact number available for                            emergencies. The signs and symptoms of potential                            delayed complications were  discussed with the                            patient. Return to normal activities tomorrow.                            Written discharge instructions were provided to the                            patient.                           - Clear liquid diet for 2 hours, then advance as                            tolerated to soft diet today.                           - Resume prior diet tomorrow.                           - Continue present medications.                           - Flovent HFA 220 mcg 2 puffs swallowed bid.                           - Await pathology results.                           - No aspirin, ibuprofen, naproxen, or other                            non-steroidal anti-inflammatory drugs.                           - Return to GI office in 6 weeks. Ladene Artist, MD 05/01/2020 9:48:15 AM This report has been signed electronically.

## 2020-05-03 ENCOUNTER — Encounter: Payer: Self-pay | Admitting: Gastroenterology

## 2020-05-03 ENCOUNTER — Telehealth: Payer: Self-pay | Admitting: *Deleted

## 2020-05-03 NOTE — Telephone Encounter (Signed)
  Follow up Call-  Call back number 05/01/2020  Post procedure Call Back phone  # [phone]  Permission to leave phone message Yes  Some recent data might be hidden     Patient questions:  Do you have a fever, pain , or abdominal swelling? No. Pain Score  0 *  Have you tolerated food without any problems? Yes.    Have you been able to return to your normal activities? Yes.    Do you have any questions about your discharge instructions: Diet   No. Medications  Yes.   Follow up visit  No.  Do you have questions or concerns about your Care? No.  Actions: * If pain score is 4 or above: No action needed, pain <4.  Pt. Had questions about inhaler prescribed,informed pt. According to instructions 2 puffs 2x daily,he verbalized understanding.   1. Have you developed a fever since your procedure? no  2.   Have you had an respiratory symptoms (SOB or cough) since your procedure? no  3.   Have you tested positive for COVID 19 since your procedure no  4.   Have you had any family members/close contacts diagnosed with the COVID 19 since your procedure?  no   If yes to any of these questions please route to Joylene John, RN and Erenest Rasher, RN

## 2021-11-01 ENCOUNTER — Inpatient Hospital Stay (HOSPITAL_COMMUNITY)
Admission: EM | Admit: 2021-11-01 | Discharge: 2021-11-05 | DRG: 392 | Disposition: A | Discharge status: Home / Self Care | Payer: 59 | Attending: Internal Medicine | Admitting: Internal Medicine

## 2021-11-01 ENCOUNTER — Emergency Department (HOSPITAL_COMMUNITY): Payer: 59

## 2021-11-01 ENCOUNTER — Other Ambulatory Visit: Payer: Self-pay

## 2021-11-01 ENCOUNTER — Encounter (HOSPITAL_COMMUNITY): Payer: Self-pay

## 2021-11-01 DIAGNOSIS — K5720 Diverticulitis of large intestine with perforation and abscess without bleeding: Secondary | ICD-10-CM | POA: Diagnosis not present

## 2021-11-01 DIAGNOSIS — K5792 Diverticulitis of intestine, part unspecified, without perforation or abscess without bleeding: Secondary | ICD-10-CM | POA: Diagnosis present

## 2021-11-01 DIAGNOSIS — Z8049 Family history of malignant neoplasm of other genital organs: Secondary | ICD-10-CM

## 2021-11-01 DIAGNOSIS — Z803 Family history of malignant neoplasm of breast: Secondary | ICD-10-CM

## 2021-11-01 DIAGNOSIS — Z8711 Personal history of peptic ulcer disease: Secondary | ICD-10-CM

## 2021-11-01 DIAGNOSIS — Z79899 Other long term (current) drug therapy: Secondary | ICD-10-CM

## 2021-11-01 DIAGNOSIS — R1032 Left lower quadrant pain: Secondary | ICD-10-CM | POA: Diagnosis not present

## 2021-11-01 DIAGNOSIS — E785 Hyperlipidemia, unspecified: Secondary | ICD-10-CM | POA: Diagnosis present

## 2021-11-01 DIAGNOSIS — E871 Hypo-osmolality and hyponatremia: Secondary | ICD-10-CM | POA: Diagnosis not present

## 2021-11-01 DIAGNOSIS — Z8249 Family history of ischemic heart disease and other diseases of the circulatory system: Secondary | ICD-10-CM

## 2021-11-01 DIAGNOSIS — Z20822 Contact with and (suspected) exposure to covid-19: Secondary | ICD-10-CM | POA: Diagnosis present

## 2021-11-01 DIAGNOSIS — Z801 Family history of malignant neoplasm of trachea, bronchus and lung: Secondary | ICD-10-CM

## 2021-11-01 DIAGNOSIS — R17 Unspecified jaundice: Secondary | ICD-10-CM | POA: Diagnosis not present

## 2021-11-01 DIAGNOSIS — K200 Eosinophilic esophagitis: Secondary | ICD-10-CM | POA: Diagnosis present

## 2021-11-01 DIAGNOSIS — Z7951 Long term (current) use of inhaled steroids: Secondary | ICD-10-CM

## 2021-11-01 LAB — CBC WITH DIFFERENTIAL/PLATELET
Abs Immature Granulocytes: 0.07 10*3/uL (ref 0.00–0.07)
Basophils Absolute: 0.1 10*3/uL (ref 0.0–0.1)
Basophils Relative: 0 %
Eosinophils Absolute: 0.3 10*3/uL (ref 0.0–0.5)
Eosinophils Relative: 2 %
HCT: 43.4 % (ref 39.0–52.0)
Hemoglobin: 14.9 g/dL (ref 13.0–17.0)
Immature Granulocytes: 1 %
Lymphocytes Relative: 12 %
Lymphs Abs: 1.7 10*3/uL (ref 0.7–4.0)
MCH: 31.3 pg (ref 26.0–34.0)
MCHC: 34.3 g/dL (ref 30.0–36.0)
MCV: 91.2 fL (ref 80.0–100.0)
Monocytes Absolute: 1.1 10*3/uL — ABNORMAL HIGH (ref 0.1–1.0)
Monocytes Relative: 8 %
Neutro Abs: 11.1 10*3/uL — ABNORMAL HIGH (ref 1.7–7.7)
Neutrophils Relative %: 77 %
Platelets: 279 10*3/uL (ref 150–400)
RBC: 4.76 MIL/uL (ref 4.22–5.81)
RDW: 12.2 % (ref 11.5–15.5)
WBC: 14.3 10*3/uL — ABNORMAL HIGH (ref 4.0–10.5)
nRBC: 0 % (ref 0.0–0.2)

## 2021-11-01 LAB — URINALYSIS, ROUTINE W REFLEX MICROSCOPIC
Bilirubin Urine: NEGATIVE
Glucose, UA: NEGATIVE mg/dL
Ketones, ur: NEGATIVE mg/dL
Leukocytes,Ua: NEGATIVE
Nitrite: NEGATIVE
Protein, ur: NEGATIVE mg/dL
Specific Gravity, Urine: 1.02 (ref 1.005–1.030)
pH: 6.5 (ref 5.0–8.0)

## 2021-11-01 LAB — URINALYSIS, MICROSCOPIC (REFLEX)
Bacteria, UA: NONE SEEN
Squamous Epithelial / HPF: NONE SEEN (ref 0–5)

## 2021-11-01 LAB — COMPREHENSIVE METABOLIC PANEL
ALT: 16 U/L (ref 0–44)
AST: 34 U/L (ref 15–41)
Albumin: 4.2 g/dL (ref 3.5–5.0)
Alkaline Phosphatase: 82 U/L (ref 38–126)
Anion gap: 12 (ref 5–15)
BUN: 18 mg/dL (ref 6–20)
CO2: 23 mmol/L (ref 22–32)
Calcium: 9.3 mg/dL (ref 8.9–10.3)
Chloride: 102 mmol/L (ref 98–111)
Creatinine, Ser: 0.95 mg/dL (ref 0.61–1.24)
GFR, Estimated: >60 mL/min (ref >60)
Glucose, Bld: 96 mg/dL (ref 70–99)
Potassium: 4.5 mmol/L (ref 3.5–5.1)
Sodium: 137 mmol/L (ref 135–145)
Total Bilirubin: 1.5 mg/dL — ABNORMAL HIGH (ref 0.3–1.2)
Total Protein: 7 g/dL (ref 6.5–8.1)

## 2021-11-01 LAB — LIPASE, BLOOD: Lipase: 31 U/L (ref 11–51)

## 2021-11-01 MED ORDER — IOHEXOL 300 MG/ML  SOLN
100.0000 mL | Freq: Once | INTRAMUSCULAR | Status: AC | PRN
  Administered 2021-11-01: 100 mL via INTRAVENOUS

## 2021-11-01 NOTE — ED Provider Triage Note (Signed)
Emergency Medicine Provider Triage Evaluation Note  Larry Pratt , a 46 y.o. male  was evaluated in triage.  Pt complains of left lower quadrant abdominal pain.  This pain started last night and is gradually worsened.  Pain is 9 out of 10 in severity. he denies any nausea, vomiting, diarrhea, constipation.  Last bowel movement was earlier today.  Denies melena.  He has not had any fevers or chills.  Denies dysuria, hematuria, flank pain  Review of Systems  Positive:  Negative:   Physical Exam  There were no vitals taken for this visit. Gen:   Awake, no distress   Resp:  Normal effort  MSK:   Moves extremities without difficulty  Other:  Significant left lower quadrant abdominal tenderness  Medical Decision Making  Medically screening exam initiated at 5:48 PM.  Appropriate orders placed.  Edem Tiegs was informed that the remainder of the evaluation will be completed by another provider, this initial triage assessment does not replace that evaluation, and the importance of remaining in the ED until their evaluation is complete.  CT imaging will be obtained.   Adolphus Birchwood, PA-C 11/01/21 1749

## 2021-11-01 NOTE — ED Triage Notes (Signed)
Pt arrived POV from home c/o left lower abdominal pain that started last night. Pt denies any N/V/D.

## 2021-11-02 ENCOUNTER — Encounter (HOSPITAL_COMMUNITY): Payer: Self-pay | Admitting: Family Medicine

## 2021-11-02 DIAGNOSIS — Z8249 Family history of ischemic heart disease and other diseases of the circulatory system: Secondary | ICD-10-CM | POA: Diagnosis not present

## 2021-11-02 DIAGNOSIS — Z7951 Long term (current) use of inhaled steroids: Secondary | ICD-10-CM | POA: Diagnosis not present

## 2021-11-02 DIAGNOSIS — R1032 Left lower quadrant pain: Secondary | ICD-10-CM | POA: Diagnosis present

## 2021-11-02 DIAGNOSIS — K200 Eosinophilic esophagitis: Secondary | ICD-10-CM | POA: Diagnosis present

## 2021-11-02 DIAGNOSIS — E871 Hypo-osmolality and hyponatremia: Secondary | ICD-10-CM | POA: Diagnosis not present

## 2021-11-02 DIAGNOSIS — Z801 Family history of malignant neoplasm of trachea, bronchus and lung: Secondary | ICD-10-CM | POA: Diagnosis not present

## 2021-11-02 DIAGNOSIS — E785 Hyperlipidemia, unspecified: Secondary | ICD-10-CM | POA: Diagnosis present

## 2021-11-02 DIAGNOSIS — Z8711 Personal history of peptic ulcer disease: Secondary | ICD-10-CM | POA: Diagnosis not present

## 2021-11-02 DIAGNOSIS — Z79899 Other long term (current) drug therapy: Secondary | ICD-10-CM | POA: Diagnosis not present

## 2021-11-02 DIAGNOSIS — R17 Unspecified jaundice: Secondary | ICD-10-CM | POA: Diagnosis not present

## 2021-11-02 DIAGNOSIS — Z8049 Family history of malignant neoplasm of other genital organs: Secondary | ICD-10-CM | POA: Diagnosis not present

## 2021-11-02 DIAGNOSIS — K5792 Diverticulitis of intestine, part unspecified, without perforation or abscess without bleeding: Secondary | ICD-10-CM | POA: Diagnosis not present

## 2021-11-02 DIAGNOSIS — K5720 Diverticulitis of large intestine with perforation and abscess without bleeding: Secondary | ICD-10-CM | POA: Diagnosis present

## 2021-11-02 DIAGNOSIS — Z20822 Contact with and (suspected) exposure to covid-19: Secondary | ICD-10-CM | POA: Diagnosis present

## 2021-11-02 DIAGNOSIS — Z803 Family history of malignant neoplasm of breast: Secondary | ICD-10-CM | POA: Diagnosis not present

## 2021-11-02 LAB — BASIC METABOLIC PANEL
Anion gap: 7 (ref 5–15)
BUN: 15 mg/dL (ref 6–20)
CO2: 25 mmol/L (ref 22–32)
Calcium: 8.6 mg/dL — ABNORMAL LOW (ref 8.9–10.3)
Chloride: 102 mmol/L (ref 98–111)
Creatinine, Ser: 0.87 mg/dL (ref 0.61–1.24)
GFR, Estimated: >60 mL/min (ref >60)
Glucose, Bld: 106 mg/dL — ABNORMAL HIGH (ref 70–99)
Potassium: 3.9 mmol/L (ref 3.5–5.1)
Sodium: 134 mmol/L — ABNORMAL LOW (ref 135–145)

## 2021-11-02 LAB — HEPATIC FUNCTION PANEL
ALT: 14 U/L (ref 0–44)
AST: 15 U/L (ref 15–41)
Albumin: 3.7 g/dL (ref 3.5–5.0)
Alkaline Phosphatase: 84 U/L (ref 38–126)
Bilirubin, Direct: 0.2 mg/dL (ref 0.0–0.2)
Indirect Bilirubin: 1.2 mg/dL — ABNORMAL HIGH (ref 0.3–0.9)
Total Bilirubin: 1.4 mg/dL — ABNORMAL HIGH (ref 0.3–1.2)
Total Protein: 6.8 g/dL (ref 6.5–8.1)

## 2021-11-02 LAB — RESP PANEL BY RT-PCR (FLU A&B, COVID) ARPGX2
Influenza A by PCR: NEGATIVE
Influenza B by PCR: NEGATIVE
SARS Coronavirus 2 by RT PCR: NEGATIVE

## 2021-11-02 LAB — CBC
HCT: 40.9 % (ref 39.0–52.0)
Hemoglobin: 13.9 g/dL (ref 13.0–17.0)
MCH: 30.9 pg (ref 26.0–34.0)
MCHC: 34 g/dL (ref 30.0–36.0)
MCV: 90.9 fL (ref 80.0–100.0)
Platelets: 238 10*3/uL (ref 150–400)
RBC: 4.5 MIL/uL (ref 4.22–5.81)
RDW: 12.4 % (ref 11.5–15.5)
WBC: 12.7 10*3/uL — ABNORMAL HIGH (ref 4.0–10.5)
nRBC: 0 % (ref 0.0–0.2)

## 2021-11-02 LAB — HIV ANTIBODY (ROUTINE TESTING W REFLEX): HIV Screen 4th Generation wRfx: NONREACTIVE

## 2021-11-02 MED ORDER — HYDROCODONE-ACETAMINOPHEN 5-325 MG PO TABS
1.0000 | ORAL_TABLET | ORAL | Status: DC | PRN
  Administered 2021-11-02: 1 via ORAL
  Administered 2021-11-02 – 2021-11-04 (×5): 2 via ORAL
  Filled 2021-11-02 (×3): qty 2
  Filled 2021-11-02: qty 1
  Filled 2021-11-02 (×2): qty 2

## 2021-11-02 MED ORDER — METRONIDAZOLE 500 MG/100ML IV SOLN
500.0000 mg | Freq: Once | INTRAVENOUS | Status: AC
  Administered 2021-11-02: 500 mg via INTRAVENOUS
  Filled 2021-11-02: qty 100

## 2021-11-02 MED ORDER — ONDANSETRON HCL 4 MG PO TABS: 4.0000 mg | ORAL_TABLET | Freq: Four times a day (QID) | ORAL | Status: DC | PRN

## 2021-11-02 MED ORDER — MORPHINE SULFATE (PF) 4 MG/ML IV SOLN
4.0000 mg | INTRAVENOUS | Status: DC | PRN
  Administered 2021-11-02: 4 mg via INTRAVENOUS
  Filled 2021-11-02: qty 1

## 2021-11-02 MED ORDER — METRONIDAZOLE 500 MG/100ML IV SOLN
500.0000 mg | Freq: Two times a day (BID) | INTRAVENOUS | Status: DC
  Administered 2021-11-02 – 2021-11-03 (×2): 500 mg via INTRAVENOUS
  Filled 2021-11-02 (×2): qty 100

## 2021-11-02 MED ORDER — ONDANSETRON HCL 4 MG/2ML IJ SOLN: 4.0000 mg | Freq: Four times a day (QID) | INTRAMUSCULAR | Status: DC | PRN

## 2021-11-02 MED ORDER — ENOXAPARIN SODIUM 40 MG/0.4ML IJ SOSY
40.0000 mg | PREFILLED_SYRINGE | INTRAMUSCULAR | Status: DC
  Filled 2021-11-02 (×2): qty 0.4

## 2021-11-02 MED ORDER — ROSUVASTATIN CALCIUM 5 MG PO TABS
5.0000 mg | ORAL_TABLET | Freq: Every day | ORAL | Status: DC
  Administered 2021-11-03 – 2021-11-05 (×3): 5 mg via ORAL
  Filled 2021-11-02 (×3): qty 1

## 2021-11-02 MED ORDER — ACETAMINOPHEN 650 MG RE SUPP: 650.0000 mg | Freq: Four times a day (QID) | RECTAL | Status: DC | PRN

## 2021-11-02 MED ORDER — SODIUM CHLORIDE 0.9 % IV SOLN
2.0000 g | INTRAVENOUS | Status: DC
  Administered 2021-11-02: 2 g via INTRAVENOUS
  Filled 2021-11-02: qty 20

## 2021-11-02 MED ORDER — SODIUM CHLORIDE 0.9 % IV SOLN
2.0000 g | Freq: Once | INTRAVENOUS | Status: AC
  Administered 2021-11-02: 2 g via INTRAVENOUS
  Filled 2021-11-02: qty 20

## 2021-11-02 MED ORDER — SODIUM CHLORIDE 0.9 % IV SOLN: INTRAVENOUS | Status: DC

## 2021-11-02 MED ORDER — ACETAMINOPHEN 325 MG PO TABS
650.0000 mg | ORAL_TABLET | Freq: Four times a day (QID) | ORAL | Status: DC | PRN
  Filled 2021-11-02: qty 2

## 2021-11-02 NOTE — ED Notes (Signed)
Nurse unavailable for report.  

## 2021-11-02 NOTE — Progress Notes (Signed)
Patient admitted after midnight with acute diverticulitis.  1st episode.  Never had colonoscopy (EGD done in past with [facility]).  Will plan liquid diet, IVF, IV abx.  Monitor for improvement.  Advance diet as able.  Suspect 24-48 hours in hospital. Eulogio Bear DO

## 2021-11-02 NOTE — ED Provider Notes (Signed)
[facility] EMERGENCY DEPARTMENT Provider Note   CSN: [id number] Arrival date & time: 11/01/21  1640     History  Chief Complaint  Patient presents with   Abdominal Pain    Larry Pratt is a 46 y.o. male.  46 yo M with a chief complaints of left lower quadrant abdominal discomfort.  Started at dinner last night and persisted throughout the day.  Worse with movement palpation and twisting.  No fevers no diarrhea no vomiting.  Never had a thing like this happen to him before.  Has a history of a gastric ulcer that improved with PPIs.  The history is provided by the patient.  Abdominal Pain Pain location:  LLQ Pain quality: sharp and shooting   Pain radiates to:  Does not radiate Pain severity:  Moderate Onset quality:  Gradual Duration:  24 hours Timing:  Constant Progression:  Waxing and waning Chronicity:  New Relieved by:  Nothing Worsened by:  Nothing Ineffective treatments:  None tried Associated symptoms: no chest pain, no chills, no diarrhea, no fever, no shortness of breath and no vomiting       Home Medications Prior to Admission medications   Medication Sig Start Date End Date Taking? Authorizing Provider  rosuvastatin (CRESTOR) 5 MG tablet Take 5 mg by mouth daily. 10/09/21  Yes [provider]  diclofenac sodium (VOLTAREN) 1 % GEL Apply 4 g topically 4 (four) times daily. Patient not taking: Reported on 03/31/2020 04/11/16   Garrel Ridgel, DPM  fluticasone [facility]) 220 MCG/ACT inhaler INHALE and SWALLOW per Doctor Patient not taking: Reported on 11/02/2021 05/01/20   Ladene Artist, MD  pantoprazole (PROTONIX) 40 MG tablet Take 1 tablet (40 mg total) by mouth daily. Patient not taking: Reported on 11/02/2021 04/04/20   Ladene Artist, MD      Allergies    Patient has no known allergies.    Review of Systems   Review of Systems  Constitutional:  Negative for chills and fever.  HENT:  Negative for congestion and  facial swelling.   Eyes:  Negative for discharge and visual disturbance.  Respiratory:  Negative for shortness of breath.   Cardiovascular:  Negative for chest pain and palpitations.  Gastrointestinal:  Positive for abdominal pain. Negative for diarrhea and vomiting.  Musculoskeletal:  Negative for arthralgias and myalgias.  Skin:  Negative for color change and rash.  Neurological:  Negative for tremors, syncope and headaches.  Psychiatric/Behavioral:  Negative for confusion and dysphoric mood.    Physical Exam Updated Vital Signs BP 140/90    Pulse 81    Temp 99.2 F (37.3 C)    Resp (!) 22    Ht 6\' 1"  (1.854 m)    Wt 95.3 kg    SpO2 98%    BMI 27.71 kg/m  Physical Exam Vitals and nursing note reviewed.  Constitutional:      Appearance: He is well-developed.  HENT:     Head: Normocephalic and atraumatic.  Eyes:     Pupils: Pupils are equal, round, and reactive to light.  Neck:     Vascular: No JVD.  Cardiovascular:     Rate and Rhythm: Normal rate and regular rhythm.     Heart sounds: No murmur heard.   No friction rub. No gallop.  Pulmonary:     Effort: No respiratory distress.     Breath sounds: No wheezing.  Abdominal:     General: There is no distension.  Tenderness: There is no abdominal tenderness. There is no guarding or rebound.     Comments: No significant pain on exam  Musculoskeletal:        General: Normal range of motion.     Cervical back: Normal range of motion and neck supple.  Skin:    Coloration: Skin is not pale.     Findings: No rash.  Neurological:     Mental Status: He is alert and oriented to person, place, and time.  Psychiatric:        Behavior: Behavior normal.    ED Results / Procedures / Treatments   Labs (all labs ordered are listed, but only abnormal results are displayed) Labs Reviewed  CBC WITH DIFFERENTIAL/PLATELET - Abnormal; Notable for the following components:      Result Value   WBC 14.3 (*)    Neutro Abs 11.1 (*)     Monocytes Absolute 1.1 (*)    All other components within normal limits  COMPREHENSIVE METABOLIC PANEL - Abnormal; Notable for the following components:   Total Bilirubin 1.5 (*)    All other components within normal limits  URINALYSIS, ROUTINE W REFLEX MICROSCOPIC - Abnormal; Notable for the following components:   Hgb urine dipstick TRACE (*)    All other components within normal limits  RESP PANEL BY RT-PCR (FLU A&B, COVID) ARPGX2  LIPASE, BLOOD  URINALYSIS, MICROSCOPIC (REFLEX)  HIV ANTIBODY (ROUTINE TESTING W REFLEX)  BASIC METABOLIC PANEL  HEPATIC FUNCTION PANEL  CBC    EKG None  Radiology CT Abdomen Pelvis W Contrast  Result Date: 11/01/2021 CLINICAL DATA:  Left lower quadrant pain EXAM: CT ABDOMEN AND PELVIS WITH CONTRAST TECHNIQUE: Multidetector CT imaging of the abdomen and pelvis was performed using the standard protocol following bolus administration of intravenous contrast. CONTRAST:  169mL OMNIPAQUE IOHEXOL 300 MG/ML  SOLN COMPARISON:  None. FINDINGS: Lower chest: No acute abnormality. Hepatobiliary: No focal liver abnormality is seen. No gallstones, gallbladder wall thickening, or biliary dilatation. Pancreas: Unremarkable. No pancreatic ductal dilatation or surrounding inflammatory changes. Spleen: Normal in size without focal abnormality. Adrenals/Urinary Tract: Adrenal glands are unremarkable. Kidneys are normal, without renal calculi, focal lesion, or hydronephrosis. Bladder is slightly thick walled but is under distended Stomach/Bowel: Stomach nonenlarged. Negative appendix. No dilated small bowel. Diverticular disease of left colon. Focal wall thickening at the distal descending and proximal sigmoid colon with soft tissue stranding consistent with acute diverticulitis. No abscess. Tiny focus of gas in the region of left lower quadrant stranding, indeterminate for focus of contained perforation. Vascular/Lymphatic: Mild aortic atherosclerosis. No aneurysm. No suspicious  lymph nodes. Reproductive: Prostate is unremarkable. Other: Negative for pelvic effusion or free air. Small fat containing left inguinal hernia Musculoskeletal: No acute or significant osseous findings. IMPRESSION: 1. Findings consistent with acute diverticulitis involving the distal descending and proximal sigmoid colon. Possible tiny focus of contained perforation at the site of inflammation. No abscess. Electronically Signed   By: Donavan Foil M.D.   On: 11/01/2021 21:29    Procedures Procedures    Medications Ordered in ED Medications  cefTRIAXone (ROCEPHIN) 2 g in sodium chloride 0.9 % 100 mL IVPB (0 g Intravenous Stopped 11/02/21 0413)    And  metroNIDAZOLE (FLAGYL) IVPB 500 mg (500 mg Intravenous New Bag/Given 11/02/21 0413)  rosuvastatin (CRESTOR) tablet 5 mg (has no administration in time range)  enoxaparin (LOVENOX) injection 40 mg (has no administration in time range)  0.9 %  sodium chloride infusion (has no administration in time range)  acetaminophen (TYLENOL) tablet 650 mg (has no administration in time range)    Or  acetaminophen (TYLENOL) suppository 650 mg (has no administration in time range)  morphine 4 MG/ML injection 4 mg (has no administration in time range)  HYDROcodone-acetaminophen (NORCO/VICODIN) 5-325 MG per tablet 1-2 tablet (has no administration in time range)  ondansetron (ZOFRAN) tablet 4 mg (has no administration in time range)    Or  ondansetron (ZOFRAN) injection 4 mg (has no administration in time range)  cefTRIAXone (ROCEPHIN) 2 g in sodium chloride 0.9 % 100 mL IVPB (has no administration in time range)  metroNIDAZOLE (FLAGYL) IVPB 500 mg (has no administration in time range)  iohexol (OMNIPAQUE) 300 MG/ML solution 100 mL (100 mLs Intravenous Contrast Given 11/01/21 2119)    ED Course/ Medical Decision Making/ A&P                           Medical Decision Making  46 yo M with a chief complaints of left lower quadrant abdominal discomfort.  Going on  for about 24 hours.  CT scan concerning for diverticulitis with perforation.  Will start on antibiotics.  Will discuss with medicine for admission.  Mild leukocytosis, no significant anemia no significant electrolyte abnormality.  The patients results and plan were reviewed and discussed.   Any x-rays performed were independently reviewed by myself.   Differential diagnosis were considered with the presenting HPI.  Medications  cefTRIAXone (ROCEPHIN) 2 g in sodium chloride 0.9 % 100 mL IVPB (0 g Intravenous Stopped 11/02/21 0413)    And  metroNIDAZOLE (FLAGYL) IVPB 500 mg (500 mg Intravenous New Bag/Given 11/02/21 0413)  rosuvastatin (CRESTOR) tablet 5 mg (has no administration in time range)  enoxaparin (LOVENOX) injection 40 mg (has no administration in time range)  0.9 %  sodium chloride infusion (has no administration in time range)  acetaminophen (TYLENOL) tablet 650 mg (has no administration in time range)    Or  acetaminophen (TYLENOL) suppository 650 mg (has no administration in time range)  morphine 4 MG/ML injection 4 mg (has no administration in time range)  HYDROcodone-acetaminophen (NORCO/VICODIN) 5-325 MG per tablet 1-2 tablet (has no administration in time range)  ondansetron (ZOFRAN) tablet 4 mg (has no administration in time range)    Or  ondansetron (ZOFRAN) injection 4 mg (has no administration in time range)  cefTRIAXone (ROCEPHIN) 2 g in sodium chloride 0.9 % 100 mL IVPB (has no administration in time range)  metroNIDAZOLE (FLAGYL) IVPB 500 mg (has no administration in time range)  iohexol (OMNIPAQUE) 300 MG/ML solution 100 mL (100 mLs Intravenous Contrast Given 11/01/21 2119)    Vitals:   11/01/21 1750 11/01/21 2117 11/01/21 2359 11/02/21 0300  BP: (!) 155/105 (!) 154/99 (!) 136/98 140/90  Pulse: 80 77 94 81  Resp: 18 16 20  (!) 22  Temp: 98 F (36.7 C) 98.2 F (36.8 C) 99.2 F (37.3 C)   TempSrc: Oral Oral    SpO2: 99% 100% 98% 98%  Weight: 95.3 kg      Height: 6\' 1"  (1.854 m)       Final diagnoses:  Diverticulitis of large intestine with perforation without bleeding    Admission/ observation were discussed with the admitting physician, patient and/or family and they are comfortable with the plan.          Final Clinical Impression(s) / ED Diagnoses Final diagnoses:  Diverticulitis of large intestine with perforation without bleeding    Rx /  DC Orders ED Discharge Orders     None         Deno Etienne, [place] 11/02/21 [phone]

## 2021-11-02 NOTE — H&P (Signed)
History and Physical    Larry Pratt FKC:[id number] DOB: [birth year] DOA: 11/01/2021  PCP: Prince Solian, MD   Patient coming from: Home   Chief Complaint: Abdominal pain   HPI: Larry Pratt is a pleasant 46 y.o. male with medical history significant for hyperlipidemia and gastric ulcer, now presenting to the emergency department for evaluation of left lower quadrant abdominal pain.  Patient reports that he been in his usual state of health and was having an uneventful day until yesterday evening when he developed severe pain in the left lower quadrant of his abdomen.  He denies any associated fever, chills, vomiting, diarrhea, melena, or hematochezia.  He had never experienced this previously.  ED Course: Upon arrival to the ED, patient is found to be afebrile, saturating well on room air, and with stable blood pressure.  Chemistry panel notable for bilirubin 1.5.  CBC features a leukocytosis to 14,300.  CT of the abdomen and pelvis is concerning for acute diverticulitis involving distal descending and proximal sigmoid colon with suspected microperforation.  Patient was started on Rocephin and Flagyl in the ED.  Review of Systems:  All other systems reviewed and apart from HPI, are negative.  Past Medical History:  Diagnosis Date   History of stomach ulcers     Past Surgical History:  Procedure Laterality Date   APPENDECTOMY     BIOPSY  03/31/2020   Procedure: BIOPSY;  Surgeon: Ladene Artist, MD;  Location: [facility] ENDOSCOPY;  Service: Endoscopy;;   ESOPHAGOGASTRODUODENOSCOPY (EGD) WITH PROPOFOL N/A 03/31/2020   Procedure: ESOPHAGOGASTRODUODENOSCOPY (EGD) WITH PROPOFOL;  Surgeon: Ladene Artist, MD;  Location: [facility] ENDOSCOPY;  Service: Endoscopy;  Laterality: N/A;   HERNIA REPAIR     IMPACTION REMOVAL  03/31/2020   Procedure: IMPACTION REMOVAL;  Surgeon: Ladene Artist, MD;  Location: [facility] ENDOSCOPY;  Service: Endoscopy;;   UPPER GASTROINTESTINAL ENDOSCOPY       Social History:   reports that he has never smoked. He has never used smokeless tobacco. He reports current alcohol use of about 2.0 standard drinks per week. He reports that he does not use drugs.  No Known Allergies  Family History  Problem Relation Age of Onset   Cardiomyopathy Father    Uterine cancer Mother    Breast cancer Sister    Lung cancer Paternal Grandmother    Colon cancer Neg Hx    Colon polyps Neg Hx    Esophageal cancer Neg Hx    Stomach cancer Neg Hx    Rectal cancer Neg Hx      Prior to Admission medications   Medication Sig Start Date End Date Taking? Authorizing Provider  rosuvastatin (CRESTOR) 5 MG tablet Take 5 mg by mouth daily. 10/09/21  Yes [provider]  diclofenac sodium (VOLTAREN) 1 % GEL Apply 4 g topically 4 (four) times daily. Patient not taking: Reported on 03/31/2020 04/11/16   Garrel Ridgel, DPM  fluticasone [facility]) 220 MCG/ACT inhaler INHALE and SWALLOW per Doctor Patient not taking: Reported on 11/02/2021 05/01/20   Ladene Artist, MD  pantoprazole (PROTONIX) 40 MG tablet Take 1 tablet (40 mg total) by mouth daily. Patient not taking: Reported on 11/02/2021 04/04/20   Ladene Artist, MD    Physical Exam: Vitals:   11/01/21 1750 11/01/21 2117 11/01/21 2359 11/02/21 0300  BP: (!) 155/105 (!) 154/99 (!) 136/98 140/90  Pulse: 80 77 94 81  Resp: 18 16 20  (!) 22  Temp: 98 F (36.7 C) 98.2 F (  36.8 C) 99.2 F (37.3 C)   TempSrc: Oral Oral    SpO2: 99% 100% 98% 98%  Weight: 95.3 kg     Height: 6\' 1"  (1.854 m)       Constitutional: NAD, calm  Eyes: PERTLA, lids and conjunctivae normal ENMT: Mucous membranes are moist. Posterior pharynx clear of any exudate or lesions.   Neck: supple, no masses  Respiratory: no wheezing, no crackles. No accessory muscle use.  Cardiovascular: S1 & S2 heard, regular rate and rhythm. No extremity edema.  Abdomen: No distension, soft, tender in LLQ. Bowel sounds active.  Musculoskeletal:  no clubbing / cyanosis. No joint deformity upper and lower extremities.   Skin: no significant rashes, lesions, ulcers. Warm, dry, well-perfused. Neurologic: CN 2-12 grossly intact. Moving all extremities. Alert and oriented.  Psychiatric: Pleasant. Cooperative.    Labs and Imaging on Admission: I have personally reviewed following labs and imaging studies  CBC: Recent Labs  Lab 11/01/21 1759  WBC 14.3*  NEUTROABS 11.1*  HGB 14.9  HCT 43.4  MCV 91.2  PLT 810   Basic Metabolic Panel: Recent Labs  Lab 11/01/21 1759  NA 137  K 4.5  CL 102  CO2 23  GLUCOSE 96  BUN 18  CREATININE 0.95  CALCIUM 9.3   GFR: Estimated Creatinine Clearance: 109.8 mL/min (by C-G formula based on SCr of 0.95 mg/dL). Liver Function Tests: Recent Labs  Lab 11/01/21 1759  AST 34  ALT 16  ALKPHOS 82  BILITOT 1.5*  PROT 7.0  ALBUMIN 4.2   Recent Labs  Lab 11/01/21 1759  LIPASE 31   No results for input(s): AMMONIA in the last 168 hours. Coagulation Profile: No results for input(s): INR, PROTIME in the last 168 hours. Cardiac Enzymes: No results for input(s): CKTOTAL, CKMB, CKMBINDEX, TROPONINI in the last 168 hours. BNP (last 3 results) No results for input(s): PROBNP in the last 8760 hours. HbA1C: No results for input(s): HGBA1C in the last 72 hours. CBG: No results for input(s): GLUCAP in the last 168 hours. Lipid Profile: No results for input(s): CHOL, HDL, LDLCALC, TRIG, CHOLHDL, LDLDIRECT in the last 72 hours. Thyroid Function Tests: No results for input(s): TSH, T4TOTAL, FREET4, T3FREE, THYROIDAB in the last 72 hours. Anemia Panel: No results for input(s): VITAMINB12, FOLATE, FERRITIN, TIBC, IRON, RETICCTPCT in the last 72 hours. Urine analysis:    Component Value Date/Time   COLORURINE YELLOW 11/01/2021 1900   APPEARANCEUR CLEAR 11/01/2021 1900   LABSPEC 1.020 11/01/2021 1900   PHURINE 6.5 11/01/2021 1900   GLUCOSEU NEGATIVE 11/01/2021 1900   HGBUR TRACE (A) 11/01/2021  1900   BILIRUBINUR NEGATIVE 11/01/2021 1900   KETONESUR NEGATIVE 11/01/2021 1900   PROTEINUR NEGATIVE 11/01/2021 1900   NITRITE NEGATIVE 11/01/2021 1900   LEUKOCYTESUR NEGATIVE 11/01/2021 1900   Sepsis Labs: @LABRCNTIP (procalcitonin:4,lacticidven:4) ) Recent Results (from the past 240 hour(s))  Resp Panel by RT-PCR (Flu A&B, Covid) Nasopharyngeal Swab     Status: None   Collection Time: 11/02/21  2:23 AM   Specimen: Nasopharyngeal Swab; Nasopharyngeal(NP) swabs in vial transport medium  Result Value Ref Range Status   SARS Coronavirus 2 by RT PCR NEGATIVE NEGATIVE Final    Comment: (NOTE) SARS-CoV-2 target nucleic acids are NOT DETECTED.  The SARS-CoV-2 RNA is generally detectable in upper respiratory specimens during the acute phase of infection. The lowest concentration of SARS-CoV-2 viral copies this assay can detect is 138 copies/mL. A negative result does not preclude SARS-Cov-2 infection and should not be used as the  sole basis for treatment or other patient management decisions. A negative result may occur with  improper specimen collection/handling, submission of specimen other than nasopharyngeal swab, presence of viral mutation(s) within the areas targeted by this assay, and inadequate number of viral copies(<138 copies/mL). A negative result must be combined with clinical observations, patient history, and epidemiological information. The expected result is Negative.  Fact Sheet for Patients:  EntrepreneurPulse.com.au  Fact Sheet for Healthcare Providers:  IncredibleEmployment.be  This test is no t yet approved or cleared by the [place] FDA and  has been authorized for detection and/or diagnosis of SARS-CoV-2 by FDA under an Emergency Use Authorization (EUA). This EUA will remain  in effect (meaning this test can be used) for the duration of the COVID-19 declaration under Section 564(b)(1) of the Act, 21 U.S.C.section  360bbb-3(b)(1), unless the authorization is terminated  or revoked sooner.       Influenza A by PCR NEGATIVE NEGATIVE Final   Influenza B by PCR NEGATIVE NEGATIVE Final    Comment: (NOTE) The Xpert Xpress SARS-CoV-2/FLU/RSV plus assay is intended as an aid in the diagnosis of influenza from Nasopharyngeal swab specimens and should not be used as a sole basis for treatment. Nasal washings and aspirates are unacceptable for Xpert Xpress SARS-CoV-2/FLU/RSV testing.  Fact Sheet for Patients: EntrepreneurPulse.com.au  Fact Sheet for Healthcare Providers: IncredibleEmployment.be  This test is not yet approved or cleared by the [place] FDA and has been authorized for detection and/or diagnosis of SARS-CoV-2 by FDA under an Emergency Use Authorization (EUA). This EUA will remain in effect (meaning this test can be used) for the duration of the COVID-19 declaration under Section 564(b)(1) of the Act, 21 U.S.C. section 360bbb-3(b)(1), unless the authorization is terminated or revoked.  Performed at [facility], [place] [address] [place], [place] [address]      Radiological Exams on Admission: CT Abdomen Pelvis W Contrast  Result Date: 11/01/2021 CLINICAL DATA:  Left lower quadrant pain EXAM: CT ABDOMEN AND PELVIS WITH CONTRAST TECHNIQUE: Multidetector CT imaging of the abdomen and pelvis was performed using the standard protocol following bolus administration of intravenous contrast. CONTRAST:  138mL OMNIPAQUE IOHEXOL 300 MG/ML  SOLN COMPARISON:  None. FINDINGS: Lower chest: No acute abnormality. Hepatobiliary: No focal liver abnormality is seen. No gallstones, gallbladder wall thickening, or biliary dilatation. Pancreas: Unremarkable. No pancreatic ductal dilatation or surrounding inflammatory changes. Spleen: Normal in size without focal abnormality. Adrenals/Urinary Tract: Adrenal glands are unremarkable. Kidneys are normal, without renal  calculi, focal lesion, or hydronephrosis. Bladder is slightly thick walled but is under distended Stomach/Bowel: Stomach nonenlarged. Negative appendix. No dilated small bowel. Diverticular disease of left colon. Focal wall thickening at the distal descending and proximal sigmoid colon with soft tissue stranding consistent with acute diverticulitis. No abscess. Tiny focus of gas in the region of left lower quadrant stranding, indeterminate for focus of contained perforation. Vascular/Lymphatic: Mild aortic atherosclerosis. No aneurysm. No suspicious lymph nodes. Reproductive: Prostate is unremarkable. Other: Negative for pelvic effusion or free air. Small fat containing left inguinal hernia Musculoskeletal: No acute or significant osseous findings. IMPRESSION: 1. Findings consistent with acute diverticulitis involving the distal descending and proximal sigmoid colon. Possible tiny focus of contained perforation at the site of inflammation. No abscess. Electronically Signed   By: Donavan Foil M.D.   On: 11/01/2021 21:29     Assessment/Plan  1. Acute diverticulitis with microperforation  - Presents with 1 day of LLQ abdominal pain and found to have acute  diverticulitis with microperforation, no abscess  - IV antibiotics started in ED  - Continue IV Rocephin and Flagyl, IVF, pain-control, clear liquid diet for now, and repeat imaging if deteriorates or fails to improve in 2-3 days    DVT prophylaxis: Lovenox  Code Status: Full  Level of Care: Level of care: Med-Surg Family Communication: none present  Disposition Plan:  Patient is from: home  Anticipated d/c is to: home  Anticipated d/c date is: 1/15 or 11/05/21 Patient currently: pending improvement in pain and leukocytosis, may need repeat imaging  Consults called: none  Admission status: Inpatient     Vianne Bulls, MD [facility]  11/02/2021, 4:22 AM

## 2021-11-03 DIAGNOSIS — K5792 Diverticulitis of intestine, part unspecified, without perforation or abscess without bleeding: Secondary | ICD-10-CM

## 2021-11-03 LAB — CBC
HCT: 43 % (ref 39.0–52.0)
Hemoglobin: 14.2 g/dL (ref 13.0–17.0)
MCH: 30.3 pg (ref 26.0–34.0)
MCHC: 33 g/dL (ref 30.0–36.0)
MCV: 91.9 fL (ref 80.0–100.0)
Platelets: 249 10*3/uL (ref 150–400)
RBC: 4.68 MIL/uL (ref 4.22–5.81)
RDW: 12.3 % (ref 11.5–15.5)
WBC: 14.6 10*3/uL — ABNORMAL HIGH (ref 4.0–10.5)
nRBC: 0 % (ref 0.0–0.2)

## 2021-11-03 LAB — COMPREHENSIVE METABOLIC PANEL
ALT: 15 U/L (ref 0–44)
AST: 15 U/L (ref 15–41)
Albumin: 3.6 g/dL (ref 3.5–5.0)
Alkaline Phosphatase: 67 U/L (ref 38–126)
Anion gap: 8 (ref 5–15)
BUN: 11 mg/dL (ref 6–20)
CO2: 22 mmol/L (ref 22–32)
Calcium: 8.9 mg/dL (ref 8.9–10.3)
Chloride: 101 mmol/L (ref 98–111)
Creatinine, Ser: 0.89 mg/dL (ref 0.61–1.24)
GFR, Estimated: >60 mL/min (ref >60)
Glucose, Bld: 102 mg/dL — ABNORMAL HIGH (ref 70–99)
Potassium: 4.2 mmol/L (ref 3.5–5.1)
Sodium: 131 mmol/L — ABNORMAL LOW (ref 135–145)
Total Bilirubin: 0.9 mg/dL (ref 0.3–1.2)
Total Protein: 6.7 g/dL (ref 6.5–8.1)

## 2021-11-03 MED ORDER — SODIUM CHLORIDE 0.9 % IV SOLN: INTRAVENOUS | Status: DC

## 2021-11-03 MED ORDER — SODIUM CHLORIDE 0.9 % IV SOLN
3.0000 g | Freq: Four times a day (QID) | INTRAVENOUS | Status: AC
  Administered 2021-11-03 – 2021-11-04 (×5): 3 g via INTRAVENOUS
  Filled 2021-11-03 (×5): qty 8

## 2021-11-03 MED ORDER — MORPHINE SULFATE (PF) 2 MG/ML IV SOLN: 2.0000 mg | INTRAVENOUS | Status: DC | PRN

## 2021-11-03 MED ORDER — DOCUSATE SODIUM 100 MG PO CAPS: 100.0000 mg | ORAL_CAPSULE | Freq: Two times a day (BID) | ORAL | Status: DC | PRN

## 2021-11-03 MED ORDER — POLYETHYLENE GLYCOL 3350 17 G PO PACK
17.0000 g | PACK | Freq: Every day | ORAL | Status: DC
  Administered 2021-11-03 – 2021-11-05 (×3): 17 g via ORAL
  Filled 2021-11-03 (×3): qty 1

## 2021-11-03 NOTE — Plan of Care (Signed)

## 2021-11-03 NOTE — Consult Note (Signed)
Referring Provider: [facility] PCP: Prince Solian, MD  Gastroenterologist: .pgmal Reason for consultation:   diverticulitis                ASSESSMENT / PLAN   #  46 yo male with descending / sigmoid diverticulitis complicated by tiny perforation. On day # 2 of antibiotics. Still with ongoing LLQ discomfort. WBC 14.3 on in ED, down to 12.7 overnight but minor bump overnight to 14.6. Afebrile. Moderately tender in LLQ on exam.         --still early in course of treatment. Would continue on full liquids for now. Changing him to Unasyn.  --If better in a couple of days would transition to Augmentin and give 5 days --He has never had a colonoscopy. He will need one in 6-8 weeks. Our office will call to arrange.   # Eosinophilic esophagitis. Diagnosed in 2021 after food impaction. Treated with pantoprazole and topical steroids with resolution of symptoms. No symptoms since ( off PPI).   # Additional medical history listed below.   HISTORY OF PRESENT ILLNESS                                                                                                                         Chief Complaint:  diverticulitis  Larry Pratt is a 46 y.o. male with a past medical history significant for eosinophilic esohagitis, diverticulosis, diverticulitis ( this admission). See PMH for any additional medical problems.    ED course:  Patient presented to ED 11/01/21 with acute abdominal pain. The LLQ pain started Wed evening, got worse during  the night and he came to ED on Thursday. WBC ~ 14 K . CT scan remarkable for left sided diverticulitis with possibly tiny perforation. No prior history of diverticulitis. Leading up to pain he had no bowel changes. He has no history of blood in stool. Hasn't had screening colonoscopy. No Raymer of colon cancer. No other GI or general medical complaints. History of EoE but asymptomatic OFF PPI      IMAGING:  CT Abdomen Pelvis W Contrast  Result Date:  11/01/2021 CLINICAL DATA:  Left lower quadrant pain EXAM: CT ABDOMEN AND PELVIS WITH CONTRAST TECHNIQUE: Multidetector CT imaging of the abdomen and pelvis was performed using the standard protocol following bolus administration of intravenous contrast. CONTRAST:  136mL OMNIPAQUE IOHEXOL 300 MG/ML  SOLN COMPARISON:  None. FINDINGS: Lower chest: No acute abnormality. Hepatobiliary: No focal liver abnormality is seen. No gallstones, gallbladder wall thickening, or biliary dilatation. Pancreas: Unremarkable. No pancreatic ductal dilatation or surrounding inflammatory changes. Spleen: Normal in size without focal abnormality. Adrenals/Urinary Tract: Adrenal glands are unremarkable. Kidneys are normal, without renal calculi, focal lesion, or hydronephrosis. Bladder is slightly thick walled but is under distended Stomach/Bowel: Stomach nonenlarged. Negative appendix. No dilated small bowel. Diverticular disease of left colon. Focal wall thickening at the distal descending and proximal sigmoid colon with soft tissue stranding consistent with acute diverticulitis. No abscess. Tiny focus of  gas in the region of left lower quadrant stranding, indeterminate for focus of contained perforation. Vascular/Lymphatic: Mild aortic atherosclerosis. No aneurysm. No suspicious lymph nodes. Reproductive: Prostate is unremarkable. Other: Negative for pelvic effusion or free air. Small fat containing left inguinal hernia Musculoskeletal: No acute or significant osseous findings. IMPRESSION: 1. Findings consistent with acute diverticulitis involving the distal descending and proximal sigmoid colon. Possible tiny focus of contained perforation at the site of inflammation. No abscess. Electronically Signed   By: Donavan Foil M.D.   On: 11/01/2021 21:29       ENDOSCOPIC EVALUATIONS   none   Past Medical History:  Diagnosis Date   History of stomach ulcers     Past Surgical History:  Procedure Laterality Date   APPENDECTOMY      BIOPSY  03/31/2020   Procedure: BIOPSY;  Surgeon: Ladene Artist, MD;  Location: [facility] ENDOSCOPY;  Service: Endoscopy;;   ESOPHAGOGASTRODUODENOSCOPY (EGD) WITH PROPOFOL N/A 03/31/2020   Procedure: ESOPHAGOGASTRODUODENOSCOPY (EGD) WITH PROPOFOL;  Surgeon: Ladene Artist, MD;  Location: [facility] ENDOSCOPY;  Service: Endoscopy;  Laterality: N/A;   HERNIA REPAIR     IMPACTION REMOVAL  03/31/2020   Procedure: IMPACTION REMOVAL;  Surgeon: Ladene Artist, MD;  Location: [facility] ENDOSCOPY;  Service: Endoscopy;;   UPPER GASTROINTESTINAL ENDOSCOPY      Prior to Admission medications   Medication Sig Start Date End Date Taking? Authorizing Provider  rosuvastatin (CRESTOR) 5 MG tablet Take 5 mg by mouth daily. 10/09/21  Yes [provider]  diclofenac sodium (VOLTAREN) 1 % GEL Apply 4 g topically 4 (four) times daily. Patient not taking: Reported on 03/31/2020 04/11/16   Garrel Ridgel, DPM  fluticasone [facility]) 220 MCG/ACT inhaler INHALE and SWALLOW per Doctor Patient not taking: Reported on 11/02/2021 05/01/20   Ladene Artist, MD  pantoprazole (PROTONIX) 40 MG tablet Take 1 tablet (40 mg total) by mouth daily. Patient not taking: Reported on 11/02/2021 04/04/20   Ladene Artist, MD    Current Facility-Administered Medications  Medication Dose Route Frequency Provider Last Rate Last Admin   0.9 %  sodium chloride infusion   Intravenous Continuous Geradine Girt, DO 100 mL/hr at 11/03/21 1051 New Bag at 11/03/21 1051   acetaminophen (TYLENOL) tablet 650 mg  650 mg Oral Q6H PRN Opyd, Ilene Qua, MD       Or   acetaminophen (TYLENOL) suppository 650 mg  650 mg Rectal Q6H PRN Opyd, Ilene Qua, MD       docusate sodium (COLACE) capsule 100 mg  100 mg Oral BID PRN Vann, Jessica U, DO       enoxaparin (LOVENOX) injection 40 mg  40 mg Subcutaneous Q24H Opyd, Ilene Qua, MD       HYDROcodone-acetaminophen (NORCO/VICODIN) 5-325 MG per tablet 1-2 tablet  1-2 tablet Oral Q4H PRN Opyd, Ilene Qua, MD   2 tablet  at 11/03/21 1478   morphine 2 MG/ML injection 2 mg  2 mg Intravenous Q4H PRN Vann, Jessica U, DO       ondansetron (ZOFRAN) tablet 4 mg  4 mg Oral Q6H PRN Opyd, Ilene Qua, MD       Or   ondansetron (ZOFRAN) injection 4 mg  4 mg Intravenous Q6H PRN Opyd, Ilene Qua, MD       polyethylene glycol (MIRALAX / GLYCOLAX) packet 17 g  17 g Oral Daily Tye Savoy M, NP       rosuvastatin (CRESTOR) tablet 5 mg  5 mg Oral Daily  Vianne Bulls, MD   5 mg at 11/03/21 7564    Allergies as of 11/01/2021   (No Known Allergies)    Family History  Problem Relation Age of Onset   Cardiomyopathy Father    Uterine cancer Mother    Breast cancer Sister    Lung cancer Paternal Grandmother    Colon cancer Neg Hx    Colon polyps Neg Hx    Esophageal cancer Neg Hx    Stomach cancer Neg Hx    Rectal cancer Neg Hx     Social History   Socioeconomic History   Marital status: Married    Spouse name: Not on file   Number of children: Not on file   Years of education: Not on file   Highest education level: Not on file  Occupational History   Not on file  Tobacco Use   Smoking status: Never   Smokeless tobacco: Never  Substance and Sexual Activity   Alcohol use: Yes    Alcohol/week: 2.0 standard drinks    Types: 2 Shots of liquor per week    Comment: 7-8   Drug use: No   Sexual activity: Not on file  Other Topics Concern   Not on file  Social History Narrative   Iced tea, and coke Zero daily   Social Determinants of Health   Financial Resource Strain: Not on file  Food Insecurity: Not on file  Transportation Needs: Not on file  Physical Activity: Not on file  Stress: Not on file  Social Connections: Not on file  Intimate Partner Violence: Not on file    Review of Systems: All systems reviewed and negative except where noted in HPI.   OBJECTIVE    Physical Exam: Vital signs in last 24 hours: Temp:  [97.9 F (36.6 C)-98.8 F (37.1 C)] 98.8 F (37.1 C) (01/14 0901) Pulse  Rate:  [65-88] 78 (01/14 0901) Resp:  [11-19] 17 (01/14 0901) BP: (121-134)/(64-95) 127/86 (01/14 0901) SpO2:  [97 %-100 %] 97 % (01/14 0901) Last BM Date: 11/01/21  General:  Alert well developed male in NAD Psych:  Pleasant, cooperative. Normal mood and affect Eyes: Pupils equal, no icterus. Conjunctive pink Ears:  Normal auditory acuity Nose: No deformity, discharge or lesions Neck:  Supple, no masses felt Lungs:  Clear to auscultation.  Heart:  Regular rate, regular rhythm. No lower extremity edema Abdomen:  Soft, nondistended, moderate localized LLQ tenderness.  Rectal :  Deferred Msk: Symmetrical without gross deformities.  Neurologic:  Alert, oriented, grossly normal neurologically Skin:  Intact without significant lesions.    Scheduled inpatient medications  enoxaparin (LOVENOX) injection  40 mg Subcutaneous Q24H   polyethylene glycol  17 g Oral Daily   rosuvastatin  5 mg Oral Daily      Intake/Output from previous day: No intake/output data recorded. Intake/Output this shift: No intake/output data recorded.   Lab Results: Recent Labs    11/01/21 1759 11/02/21 0527 11/03/21 0956  WBC 14.3* 12.7* 14.6*  HGB 14.9 13.9 14.2  HCT 43.4 40.9 43.0  PLT 279 238 249   BMET Recent Labs    11/01/21 1759 11/02/21 0527 11/03/21 0956  NA 137 134* 131*  K 4.5 3.9 4.2  CL 102 102 101  CO2 23 25 22   GLUCOSE 96 106* 102*  BUN 18 15 11   CREATININE 0.95 0.87 0.89  CALCIUM 9.3 8.6* 8.9   LFTs Recent Labs    11/02/21 0527 11/03/21 0956  PROT 6.8 6.7  ALBUMIN  3.7 3.6  AST 15 15  ALT 14 15  ALKPHOS 84 67  BILITOT 1.4* 0.9  BILIDIR 0.2  --   IBILI 1.2*  --    PT/INR No results for input(s): LABPROT, INR in the last 72 hours. Hepatitis Panel No results for input(s): HEPBSAG, HCVAB, HEPAIGM, HEPBIGM in the last 72 hours.   . CBC Latest Ref Rng & Units 11/03/2021 11/02/2021 11/01/2021  WBC 4.0 - 10.5 K/uL 14.6(H) 12.7(H) 14.3(H)  Hemoglobin 13.0 - 17.0  g/dL 14.2 13.9 14.9  Hematocrit 39.0 - 52.0 % 43.0 40.9 43.4  Platelets 150 - 400 K/uL 249 238 279    . CMP Latest Ref Rng & Units 11/03/2021 11/02/2021 11/01/2021  Glucose 70 - 99 mg/dL 102(H) 106(H) 96  BUN 6 - 20 mg/dL 11 15 18   Creatinine 0.61 - 1.24 mg/dL 0.89 0.87 0.95  Sodium 135 - 145 mmol/L 131(L) 134(L) 137  Potassium 3.5 - 5.1 mmol/L 4.2 3.9 4.5  Chloride 98 - 111 mmol/L 101 102 102  CO2 22 - 32 mmol/L 22 25 23   Calcium 8.9 - 10.3 mg/dL 8.9 8.6(L) 9.3  Total Protein 6.5 - 8.1 g/dL 6.7 6.8 7.0  Total Bilirubin 0.3 - 1.2 mg/dL 0.9 1.4(H) 1.5(H)  Alkaline Phos 38 - 126 U/L 67 84 82  AST 15 - 41 U/L 15 15 34  ALT 0 - 44 U/L 15 14 16      Principal Problem:   Acute diverticulitis    Tye Savoy, NP-C @  11/03/2021, 11:35 AM

## 2021-11-03 NOTE — Progress Notes (Signed)
Progress Note    Larry Pratt  ZTI:[id number] DOB: 1975-12-14  DOA: 11/01/2021 PCP: Prince Solian, MD    Brief Narrative:    Medical records reviewed and are as summarized below:  Larry Pratt is an 46 y.o. male with medical history significant for hyperlipidemia and gastric ulcer, now presenting to the emergency department for evaluation of left lower quadrant abdominal pain.  Patient reports that he been in his usual state of health and was having an uneventful day until yesterday evening when he developed severe pain in the left lower quadrant of his abdomen.  He denies any associated fever, chills, vomiting, diarrhea, melena, or hematochezia.  He had never experienced this previously.  Assessment/Plan:   Principal Problem:   Acute diverticulitis    Acute diverticulitis with microperforation  - 1 day of LLQ abdominal pain and found to have acute diverticulitis with microperforation, no abscess  - IV antibiotics started in ED- change to unasyn 1/14 -IVF, pain-control -full liquids -GI consult appreciated     Eosinophilic esophagitis -PPI -outpatient GI follow up  Hyponatremia -IVF  Family Communication/Anticipated D/C date and plan/Code Status   DVT prophylaxis: Lovenox ordered. Code Status: Full Code.  Disposition Plan: Status is: Inpatient  Remains inpatient appropriate because: needs IV abx         Medical Consultants:   GI  Subjective:   Worsening LLQ pain today  Objective:    Vitals:   11/02/21 2106 11/02/21 2112 11/03/21 0424 11/03/21 0901  BP: 126/64 (!) 130/95 121/81 127/86  Pulse: 74 85 65 78  Resp: 18 18 17 17   Temp:  98.6 F (37 C) 97.9 F (36.6 C) 98.8 F (37.1 C)  TempSrc:  Oral Oral Oral  SpO2: 99% 99% 100% 97%  Weight:      Height:        Intake/Output Summary (Last 24 hours) at 11/03/2021 1350 Last data filed at 11/03/2021 1100 Gross per 24 hour  Intake 480 ml  Output --  Net 480 ml   Filed  Weights   11/01/21 1750  Weight: 95.3 kg    Exam:  General: Appearance:     Overweight male in no acute distress   Abdomen tender to palpation in LLQ  Lungs:      respirations unlabored  Heart:    Normal heart rate.    MS:   All extremities are intact.    Neurologic:   Awake, alert, oriented x 3. No apparent focal neurological           defect.      Data Reviewed:   I have personally reviewed following labs and imaging studies:  Labs: Labs show the following:   Basic Metabolic Panel: Recent Labs  Lab 11/01/21 1759 11/02/21 0527 11/03/21 0956  NA 137 134* 131*  K 4.5 3.9 4.2  CL 102 102 101  CO2 23 25 22   GLUCOSE 96 106* 102*  BUN 18 15 11   CREATININE 0.95 0.87 0.89  CALCIUM 9.3 8.6* 8.9   GFR Estimated Creatinine Clearance: 117.2 mL/min (by C-G formula based on SCr of 0.89 mg/dL). Liver Function Tests: Recent Labs  Lab 11/01/21 1759 11/02/21 0527 11/03/21 0956  AST 34 15 15  ALT 16 14 15   ALKPHOS 82 84 67  BILITOT 1.5* 1.4* 0.9  PROT 7.0 6.8 6.7  ALBUMIN 4.2 3.7 3.6   Recent Labs  Lab 11/01/21 1759  LIPASE 31   No results for input(s): AMMONIA in the last 168  hours. Coagulation profile No results for input(s): INR, PROTIME in the last 168 hours.  CBC: Recent Labs  Lab 11/01/21 1759 11/02/21 0527 11/03/21 0956  WBC 14.3* 12.7* 14.6*  NEUTROABS 11.1*  --   --   HGB 14.9 13.9 14.2  HCT 43.4 40.9 43.0  MCV 91.2 90.9 91.9  PLT 279 238 249   Cardiac Enzymes: No results for input(s): CKTOTAL, CKMB, CKMBINDEX, TROPONINI in the last 168 hours. BNP (last 3 results) No results for input(s): PROBNP in the last 8760 hours. CBG: No results for input(s): GLUCAP in the last 168 hours. D-Dimer: No results for input(s): DDIMER in the last 72 hours. Hgb A1c: No results for input(s): HGBA1C in the last 72 hours. Lipid Profile: No results for input(s): CHOL, HDL, LDLCALC, TRIG, CHOLHDL, LDLDIRECT in the last 72 hours. Thyroid function studies: No  results for input(s): TSH, T4TOTAL, T3FREE, THYROIDAB in the last 72 hours.  Invalid input(s): FREET3 Anemia work up: No results for input(s): VITAMINB12, FOLATE, FERRITIN, TIBC, IRON, RETICCTPCT in the last 72 hours. Sepsis Labs: Recent Labs  Lab 11/01/21 1759 11/02/21 0527 11/03/21 0956  WBC 14.3* 12.7* 14.6*    Microbiology Recent Results (from the past 240 hour(s))  Resp Panel by RT-PCR (Flu A&B, Covid) Nasopharyngeal Swab     Status: None   Collection Time: 11/02/21  2:23 AM   Specimen: Nasopharyngeal Swab; Nasopharyngeal(NP) swabs in vial transport medium  Result Value Ref Range Status   SARS Coronavirus 2 by RT PCR NEGATIVE NEGATIVE Final    Comment: (NOTE) SARS-CoV-2 target nucleic acids are NOT DETECTED.  The SARS-CoV-2 RNA is generally detectable in upper respiratory specimens during the acute phase of infection. The lowest concentration of SARS-CoV-2 viral copies this assay can detect is 138 copies/mL. A negative result does not preclude SARS-Cov-2 infection and should not be used as the sole basis for treatment or other patient management decisions. A negative result may occur with  improper specimen collection/handling, submission of specimen other than nasopharyngeal swab, presence of viral mutation(s) within the areas targeted by this assay, and inadequate number of viral copies(<138 copies/mL). A negative result must be combined with clinical observations, patient history, and epidemiological information. The expected result is Negative.  Fact Sheet for Patients:  EntrepreneurPulse.com.au  Fact Sheet for Healthcare Providers:  IncredibleEmployment.be  This test is no t yet approved or cleared by the [place] FDA and  has been authorized for detection and/or diagnosis of SARS-CoV-2 by FDA under an Emergency Use Authorization (EUA). This EUA will remain  in effect (meaning this test can be used) for the duration of  the COVID-19 declaration under Section 564(b)(1) of the Act, 21 U.S.C.section 360bbb-3(b)(1), unless the authorization is terminated  or revoked sooner.       Influenza A by PCR NEGATIVE NEGATIVE Final   Influenza B by PCR NEGATIVE NEGATIVE Final    Comment: (NOTE) The Xpert Xpress SARS-CoV-2/FLU/RSV plus assay is intended as an aid in the diagnosis of influenza from Nasopharyngeal swab specimens and should not be used as a sole basis for treatment. Nasal washings and aspirates are unacceptable for Xpert Xpress SARS-CoV-2/FLU/RSV testing.  Fact Sheet for Patients: EntrepreneurPulse.com.au  Fact Sheet for Healthcare Providers: IncredibleEmployment.be  This test is not yet approved or cleared by the [place] FDA and has been authorized for detection and/or diagnosis of SARS-CoV-2 by FDA under an Emergency Use Authorization (EUA). This EUA will remain in effect (meaning this test can be used) for the duration of  the COVID-19 declaration under Section 564(b)(1) of the Act, 21 U.S.C. section 360bbb-3(b)(1), unless the authorization is terminated or revoked.  Performed at [facility], [place] [address] [place], [place] [address]     Procedures and diagnostic studies:  CT Abdomen Pelvis W Contrast  Result Date: 11/01/2021 CLINICAL DATA:  Left lower quadrant pain EXAM: CT ABDOMEN AND PELVIS WITH CONTRAST TECHNIQUE: Multidetector CT imaging of the abdomen and pelvis was performed using the standard protocol following bolus administration of intravenous contrast. CONTRAST:  131mL OMNIPAQUE IOHEXOL 300 MG/ML  SOLN COMPARISON:  None. FINDINGS: Lower chest: No acute abnormality. Hepatobiliary: No focal liver abnormality is seen. No gallstones, gallbladder wall thickening, or biliary dilatation. Pancreas: Unremarkable. No pancreatic ductal dilatation or surrounding inflammatory changes. Spleen: Normal in size without focal abnormality.  Adrenals/Urinary Tract: Adrenal glands are unremarkable. Kidneys are normal, without renal calculi, focal lesion, or hydronephrosis. Bladder is slightly thick walled but is under distended Stomach/Bowel: Stomach nonenlarged. Negative appendix. No dilated small bowel. Diverticular disease of left colon. Focal wall thickening at the distal descending and proximal sigmoid colon with soft tissue stranding consistent with acute diverticulitis. No abscess. Tiny focus of gas in the region of left lower quadrant stranding, indeterminate for focus of contained perforation. Vascular/Lymphatic: Mild aortic atherosclerosis. No aneurysm. No suspicious lymph nodes. Reproductive: Prostate is unremarkable. Other: Negative for pelvic effusion or free air. Small fat containing left inguinal hernia Musculoskeletal: No acute or significant osseous findings. IMPRESSION: 1. Findings consistent with acute diverticulitis involving the distal descending and proximal sigmoid colon. Possible tiny focus of contained perforation at the site of inflammation. No abscess. Electronically Signed   By: Donavan Foil M.D.   On: 11/01/2021 21:29    Medications:    enoxaparin (LOVENOX) injection  40 mg Subcutaneous Q24H   polyethylene glycol  17 g Oral Daily   rosuvastatin  5 mg Oral Daily   Continuous Infusions:  sodium chloride 100 mL/hr at 11/03/21 1051   ampicillin-sulbactam (UNASYN) IV 3 g (11/03/21 1333)     LOS: 1 day   Geradine Girt  [facility]   How to contact the [facility] Attending or Consulting provider [place] or covering provider during after hours [place], for this patient?  Check the care team in [facility] and look for a) attending/consulting TRH provider listed and b) the [facility] team listed Log into [email or web address] and use [place]'s universal password to access. If you do not have the password, please contact the hospital operator. Locate the [facility] provider you are looking for under [facility] and page to a number  that you can be directly reached. If you still have difficulty reaching the provider, please page the [facility] (Director on Call) for the Hospitalists listed on amion for assistance.  11/03/2021, 1:50 PM

## 2021-11-03 NOTE — Progress Notes (Signed)
Pharmacy Antibiotic Note  Larry Pratt is a 46 y.o. male admitted on 11/01/2021 with  diverticulitis . The patient presented with LLQ abdominal pain and found to have acute diverticulitis with microperforation (no abscess). Pharmacy has been consulted for ampicillin/sulbactam dosing.  Plan: -Initiate ampicillin/sulbactam IV 3 g Q6H -Monitor renal function, clinical status, and length of therapy -Transition to oral and de-escalate as clinically indicated  Height: 6\' 1"  (185.4 cm) Weight: 95.3 kg (210 lb) IBW/kg (Calculated) : 79.9 kg  Temp (24hrs), Avg:98.3 F (36.8 C), Min:97.9 F (36.6 C), Max:98.8 F (37.1 C)  Recent Labs  Lab 11/01/21 1759 11/02/21 0527 11/03/21 0956  WBC 14.3* 12.7* 14.6*  CREATININE 0.95 0.87 0.89    Estimated Creatinine Clearance: 117.2 mL/min (by C-G formula based on SCr of 0.89 mg/dL).    No Known Allergies  Antimicrobials this admission: 1/13 ceftriaxone 1/13 metronidazole > 1/14 1/14 ampicillin/sulbactam >>  Dose adjustments this admission: N/A  Microbiology results: 1/14 COVID/Flu A & B: negative  Thank you for allowing pharmacy to be a part of this patients care.  Shauna Hugh, PharmD, [place]  PGY-2 Pharmacy Resident 11/03/2021 11:54 AM  Please check [email or web address] for unit-specific pharmacy phone numbers.

## 2021-11-04 LAB — CBC
HCT: 38.2 % — ABNORMAL LOW (ref 39.0–52.0)
Hemoglobin: 12.8 g/dL — ABNORMAL LOW (ref 13.0–17.0)
MCH: 30.8 pg (ref 26.0–34.0)
MCHC: 33.5 g/dL (ref 30.0–36.0)
MCV: 92 fL (ref 80.0–100.0)
Platelets: 239 10*3/uL (ref 150–400)
RBC: 4.15 MIL/uL — ABNORMAL LOW (ref 4.22–5.81)
RDW: 12.2 % (ref 11.5–15.5)
WBC: 13.3 10*3/uL — ABNORMAL HIGH (ref 4.0–10.5)
nRBC: 0 % (ref 0.0–0.2)

## 2021-11-04 LAB — BASIC METABOLIC PANEL
Anion gap: 8 (ref 5–15)
BUN: 9 mg/dL (ref 6–20)
CO2: 24 mmol/L (ref 22–32)
Calcium: 8.4 mg/dL — ABNORMAL LOW (ref 8.9–10.3)
Chloride: 102 mmol/L (ref 98–111)
Creatinine, Ser: 0.95 mg/dL (ref 0.61–1.24)
GFR, Estimated: >60 mL/min (ref >60)
Glucose, Bld: 91 mg/dL (ref 70–99)
Potassium: 3.8 mmol/L (ref 3.5–5.1)
Sodium: 134 mmol/L — ABNORMAL LOW (ref 135–145)

## 2021-11-04 MED ORDER — SODIUM CHLORIDE 0.9 % IV SOLN
3.0000 g | Freq: Four times a day (QID) | INTRAVENOUS | Status: AC
  Administered 2021-11-04 – 2021-11-05 (×2): 3 g via INTRAVENOUS
  Filled 2021-11-04 (×2): qty 8

## 2021-11-04 MED ORDER — SODIUM CHLORIDE 0.9 % IV SOLN: 3.0000 g | Freq: Four times a day (QID) | INTRAVENOUS | Status: DC

## 2021-11-04 MED ORDER — AMOXICILLIN-POT CLAVULANATE 875-125 MG PO TABS: 1.0000 | ORAL_TABLET | Freq: Two times a day (BID) | ORAL | Status: DC

## 2021-11-04 MED ORDER — AMOXICILLIN-POT CLAVULANATE 875-125 MG PO TABS
1.0000 | ORAL_TABLET | Freq: Two times a day (BID) | ORAL | Status: DC
  Administered 2021-11-05: 1 via ORAL
  Filled 2021-11-04: qty 1

## 2021-11-04 NOTE — Progress Notes (Signed)
Progress Note Hospital Day: 4  Chief Complaint:  diverticulitis       ASSESSMENT AND PLAN   #  46 yo male with descending / sigmoid diverticulitis complicated by tiny perforation. Today is day # 3 of antibiotics.   --WBC improved some overnight from 14.2 >> 12.8.  --Temp 100.5 last night BUT it was taken while patient was eating warm soup. Temp this am is 99.3.  --Getting Unasyn 3 grams Q 6 hours  --He feels much better today, no pain meds since last night. Had BM. Tolerating solid.  --Discussed with TRH. Maybe transition to Augmentin later today, advanced diet and discharge in am or even later today if continues to do well.  --Our office will contact him about appt / colonoscopy in 6-8 weeks.   # Mild hyperbilirubinemia ( 1.4 - 1.5) , predominantly indirect. Gilbert's syndrome? Remainder of liver chemistries normal  # Eosinophilic esophagitis. Diagnosed in 2021 after food impaction. Treated with pantoprazole and topical steroids with resolution of symptoms. No symptoms since ( off PPI).     SUBJECTIVE    Feels much better today. No pain meds since last night. Tolerating solids. Having BMs   OBJECTIVE      Scheduled inpatient medications:   enoxaparin (LOVENOX) injection  40 mg Subcutaneous Q24H   polyethylene glycol  17 g Oral Daily   rosuvastatin  5 mg Oral Daily   Continuous inpatient infusions:   sodium chloride 100 mL/hr at 11/04/21 0630   ampicillin-sulbactam (UNASYN) IV 3 g (11/04/21 8413)   PRN inpatient medications: acetaminophen **OR** acetaminophen, docusate sodium, HYDROcodone-acetaminophen, morphine injection, ondansetron **OR** ondansetron (ZOFRAN) IV  Vital signs in last 24 hours: Temp:  [98.3 F (36.8 C)-100.5 F (38.1 C)] 99.3 F (37.4 C) (01/15 0829) Pulse Rate:  [67-95] 92 (01/15 0829) Resp:  [17-18] 17 (01/15 0829) BP: (120-136)/(77-86) 133/85 (01/15 0829) SpO2:  [97 %-99 %] 98 % (01/15 0829) Last BM Date: 11/01/21  Intake/Output  Summary (Last 24 hours) at 11/04/2021 0836 Last data filed at 11/03/2021 2225 Gross per 24 hour  Intake 1993.8 ml  Output --  Net 1993.8 ml     Physical Exam:  General: Alert male in NAD Heart:  Regular rate and rhythm. No lower extremity edema Pulmonary: Normal respiratory effort Abdomen: Soft, Localized moderate LLQ tenderness Normal bowel sounds.  Neurologic: Alert and oriented Psych: Pleasant. Cooperative.   Filed Weights   11/01/21 1750  Weight: 95.3 kg    Intake/Output from previous day: 01/14 0701 - 01/15 0700 In: 1993.8 [P.O.:1120; I.V.:773.8; IV Piggyback:100] Out: -  Intake/Output this shift: No intake/output data recorded.    Lab Results: Recent Labs    11/02/21 0527 11/03/21 0956 11/04/21 0144  WBC 12.7* 14.6* 13.3*  HGB 13.9 14.2 12.8*  HCT 40.9 43.0 38.2*  PLT 238 249 239   BMET Recent Labs    11/02/21 0527 11/03/21 0956 11/04/21 0144  NA 134* 131* 134*  K 3.9 4.2 3.8  CL 102 101 102  CO2 25 22 24   GLUCOSE 106* 102* 91  BUN 15 11 9   CREATININE 0.87 0.89 0.95  CALCIUM 8.6* 8.9 8.4*   LFT Recent Labs    11/02/21 0527 11/03/21 0956  PROT 6.8 6.7  ALBUMIN 3.7 3.6  AST 15 15  ALT 14 15  ALKPHOS 84 67  BILITOT 1.4* 0.9  BILIDIR 0.2  --   IBILI 1.2*  --    PT/INR No results for input(s): LABPROT, INR in the last  72 hours. Hepatitis Panel No results for input(s): HEPBSAG, HCVAB, HEPAIGM, HEPBIGM in the last 72 hours.  No results found.      Principal Problem:   Acute diverticulitis     LOS: 2 days   Tye Savoy ,NP 11/04/2021, 8:36 AM

## 2021-11-04 NOTE — Plan of Care (Signed)
°  Problem: Clinical Measurements: Goal: Diagnostic test results will improve Outcome: Not Progressing   Problem: Clinical Measurements: Goal: Will remain free from infection Outcome: Not Progressing   Problem: Elimination: Goal: Will not experience complications related to bowel motility Outcome: Not Progressing   Problem: Pain Managment: Goal: General experience of comfort will improve Outcome: Not Progressing

## 2021-11-04 NOTE — Progress Notes (Signed)
Progress Note    Larry Pratt  YTK:[id number] DOB: [birth year]  DOA: 11/01/2021 PCP: Prince Solian, MD    Brief Narrative:    Medical records reviewed and are as summarized below:  Larry Pratt is an 46 y.o. male with medical history significant for hyperlipidemia and gastric ulcer, now presenting to the emergency department for evaluation of left lower quadrant abdominal pain.  Patient reports that he been in his usual state of health and was having an uneventful day until yesterday evening when he developed severe pain in the left lower quadrant of his abdomen.  He denies any associated fever, chills, vomiting, diarrhea, melena, or hematochezia.  He had never experienced this previously.    Assessment/Plan:   Principal Problem:   Acute diverticulitis    Acute diverticulitis with microperforation  - 1 day of LLQ abdominal pain and found to have acute diverticulitis with microperforation, no abscess  - IV antibiotics started in ED- change to unasyn 1/14- plan for augmentin in AM -IVF, pain-control -advance diet -GI consult appreciated     Eosinophilic esophagitis -PPI -outpatient GI follow up  Hyponatremia -IVF  Family Communication/Anticipated D/C date and plan/Code Status   DVT prophylaxis: Lovenox ordered. Code Status: Full Code.  Disposition Plan: Status is: Inpatient  Remains inpatient appropriate because: needs IV abx         Medical Consultants:   GI  Subjective:  Says his IV hurts worse than his abdomen Says he was eating hot soup when they checked his temp last PM   Objective:    Vitals:   11/03/21 2044 11/03/21 2057 11/04/21 0411 11/04/21 0829  BP: 136/85  122/77 133/85  Pulse: 95  67 92  Resp: 18   17  Temp: (!) 100.5 F (38.1 C) 98.9 F (37.2 C) 98.3 F (36.8 C) 99.3 F (37.4 C)  TempSrc: Oral Oral Oral Oral  SpO2: 99%  97% 98%  Weight:      Height:        Intake/Output Summary (Last 24 hours) at  11/04/2021 1408 Last data filed at 11/04/2021 1142 Gross per 24 hour  Intake 1544.82 ml  Output --  Net 1544.82 ml   Filed Weights   11/01/21 1750  Weight: 95.3 kg    Exam:  General: Appearance:     Overweight male in no acute distress   +BS, soft, NT  Lungs:      respirations unlabored              Data Reviewed:   I have personally reviewed following labs and imaging studies:  Labs: Labs show the following:   Basic Metabolic Panel: Recent Labs  Lab 11/01/21 1759 11/02/21 0527 11/03/21 0956 11/04/21 0144  NA 137 134* 131* 134*  K 4.5 3.9 4.2 3.8  CL 102 102 101 102  CO2 23 25 22 24   GLUCOSE 96 106* 102* 91  BUN 18 15 11 9   CREATININE 0.95 0.87 0.89 0.95  CALCIUM 9.3 8.6* 8.9 8.4*   GFR Estimated Creatinine Clearance: 109.8 mL/min (by C-G formula based on SCr of 0.95 mg/dL). Liver Function Tests: Recent Labs  Lab 11/01/21 1759 11/02/21 0527 11/03/21 0956  AST 34 15 15  ALT 16 14 15   ALKPHOS 82 84 67  BILITOT 1.5* 1.4* 0.9  PROT 7.0 6.8 6.7  ALBUMIN 4.2 3.7 3.6   Recent Labs  Lab 11/01/21 1759  LIPASE 31   No results for input(s): AMMONIA in the last 168 hours. Coagulation  profile No results for input(s): INR, PROTIME in the last 168 hours.  CBC: Recent Labs  Lab 11/01/21 1759 11/02/21 0527 11/03/21 0956 11/04/21 0144  WBC 14.3* 12.7* 14.6* 13.3*  NEUTROABS 11.1*  --   --   --   HGB 14.9 13.9 14.2 12.8*  HCT 43.4 40.9 43.0 38.2*  MCV 91.2 90.9 91.9 92.0  PLT 279 238 249 239   Cardiac Enzymes: No results for input(s): CKTOTAL, CKMB, CKMBINDEX, TROPONINI in the last 168 hours. BNP (last 3 results) No results for input(s): PROBNP in the last 8760 hours. CBG: No results for input(s): GLUCAP in the last 168 hours. D-Dimer: No results for input(s): DDIMER in the last 72 hours. Hgb A1c: No results for input(s): HGBA1C in the last 72 hours. Lipid Profile: No results for input(s): CHOL, HDL, LDLCALC, TRIG, CHOLHDL, LDLDIRECT in the  last 72 hours. Thyroid function studies: No results for input(s): TSH, T4TOTAL, T3FREE, THYROIDAB in the last 72 hours.  Invalid input(s): FREET3 Anemia work up: No results for input(s): VITAMINB12, FOLATE, FERRITIN, TIBC, IRON, RETICCTPCT in the last 72 hours. Sepsis Labs: Recent Labs  Lab 11/01/21 1759 11/02/21 0527 11/03/21 0956 11/04/21 0144  WBC 14.3* 12.7* 14.6* 13.3*    Microbiology Recent Results (from the past 240 hour(s))  Resp Panel by RT-PCR (Flu A&B, Covid) Nasopharyngeal Swab     Status: None   Collection Time: 11/02/21  2:23 AM   Specimen: Nasopharyngeal Swab; Nasopharyngeal(NP) swabs in vial transport medium  Result Value Ref Range Status   SARS Coronavirus 2 by RT PCR NEGATIVE NEGATIVE Final    Comment: (NOTE) SARS-CoV-2 target nucleic acids are NOT DETECTED.  The SARS-CoV-2 RNA is generally detectable in upper respiratory specimens during the acute phase of infection. The lowest concentration of SARS-CoV-2 viral copies this assay can detect is 138 copies/mL. A negative result does not preclude SARS-Cov-2 infection and should not be used as the sole basis for treatment or other patient management decisions. A negative result may occur with  improper specimen collection/handling, submission of specimen other than nasopharyngeal swab, presence of viral mutation(s) within the areas targeted by this assay, and inadequate number of viral copies(<138 copies/mL). A negative result must be combined with clinical observations, patient history, and epidemiological information. The expected result is Negative.  Fact Sheet for Patients:  EntrepreneurPulse.com.au  Fact Sheet for Healthcare Providers:  IncredibleEmployment.be  This test is no t yet approved or cleared by the [place] FDA and  has been authorized for detection and/or diagnosis of SARS-CoV-2 by FDA under an Emergency Use Authorization (EUA). This EUA will remain   in effect (meaning this test can be used) for the duration of the COVID-19 declaration under Section 564(b)(1) of the Act, 21 U.S.C.section 360bbb-3(b)(1), unless the authorization is terminated  or revoked sooner.       Influenza A by PCR NEGATIVE NEGATIVE Final   Influenza B by PCR NEGATIVE NEGATIVE Final    Comment: (NOTE) The Xpert Xpress SARS-CoV-2/FLU/RSV plus assay is intended as an aid in the diagnosis of influenza from Nasopharyngeal swab specimens and should not be used as a sole basis for treatment. Nasal washings and aspirates are unacceptable for Xpert Xpress SARS-CoV-2/FLU/RSV testing.  Fact Sheet for Patients: EntrepreneurPulse.com.au  Fact Sheet for Healthcare Providers: IncredibleEmployment.be  This test is not yet approved or cleared by the [place] FDA and has been authorized for detection and/or diagnosis of SARS-CoV-2 by FDA under an Emergency Use Authorization (EUA). This EUA will remain in  effect (meaning this test can be used) for the duration of the COVID-19 declaration under Section 564(b)(1) of the Act, 21 U.S.C. section 360bbb-3(b)(1), unless the authorization is terminated or revoked.  Performed at [facility], [place] [address], [place], [place] [address]     Procedures and diagnostic studies:  No results found.  Medications:    [START ON 11/05/2021] amoxicillin-clavulanate  1 tablet Oral Q12H   enoxaparin (LOVENOX) injection  40 mg Subcutaneous Q24H   polyethylene glycol  17 g Oral Daily   rosuvastatin  5 mg Oral Daily   Continuous Infusions:  ampicillin-sulbactam (UNASYN) IV       LOS: 2 days   Geradine Girt  [facility]   How to contact the [facility] Attending or Consulting provider [place] or covering provider during after hours [place], for this patient?  Check the care team in [facility] and look for a) attending/consulting TRH provider listed and b) the [facility] team listed Log into  [email or web address] and use [place]'s universal password to access. If you do not have the password, please contact the hospital operator. Locate the [facility] provider you are looking for under [facility] and page to a number that you can be directly reached. If you still have difficulty reaching the provider, please page the [facility] (Director on Call) for the Hospitalists listed on amion for assistance.  11/04/2021, 2:08 PM

## 2021-11-05 MED ORDER — AMOXICILLIN-POT CLAVULANATE 875-125 MG PO TABS: 1.0000 | ORAL_TABLET | Freq: Two times a day (BID) | ORAL | 0 refills | Status: DC

## 2021-11-05 MED ORDER — HYDROCODONE-ACETAMINOPHEN 5-325 MG PO TABS: 1.0000 | ORAL_TABLET | ORAL | 0 refills | Status: DC | PRN

## 2021-11-05 NOTE — Discharge Summary (Signed)
Physician Discharge Summary  Larry Pratt WVP:[id number] DOB: [birth year] DOA: 11/01/2021  PCP: Prince Solian, MD  Admit date: 11/01/2021 Discharge date: 11/05/2021  Admitted From: home Discharge disposition: home   Recommendations for Outpatient Follow-Up:   GI follow up for colonoscopy once recovered   Discharge Diagnosis:   Principal Problem:   Acute diverticulitis    Discharge Condition: Improved.  Diet recommendation:  Regular.  Wound care: None.  Code status: Full.   History of Present Illness:    Larry Pratt is a pleasant 46 y.o. male with medical history significant for hyperlipidemia and gastric ulcer, now presenting to the emergency department for evaluation of left lower quadrant abdominal pain.  Patient reports that he been in his usual state of health and was having an uneventful day until yesterday evening when he developed severe pain in the left lower quadrant of his abdomen.  He denies any associated fever, chills, vomiting, diarrhea, melena, or hematochezia.  He had never experienced this previously.   Hospital Course by Problem:   Acute diverticulitis with microperforation  - 1 day of LLQ abdominal pain and found to have acute diverticulitis with microperforation, no abscess  - IV antibiotics started in ED- changed to unasyn 1/14- plan for augmentin to finish treatment -IVF, pain-control -advanced diet -GI consult appreciated- will need colonoscopy     Eosinophilic esophagitis -outpatient GI follow up  -not on PPI  Hyponatremia -resolved    Medical Consultants:   GI   Discharge Exam:   Vitals:   11/05/21 0546 11/05/21 0758  BP: (!) 126/92 125/86  Pulse: 62 72  Resp: 18 18  Temp: (!) 97.5 F (36.4 C) 97.6 F (36.4 C)  SpO2: 100% 98%   Vitals:   11/04/21 1535 11/04/21 2104 11/05/21 0546 11/05/21 0758  BP: 126/82 136/85 (!) 126/92 125/86  Pulse: 74 78 62 72  Resp: 18  18 18   Temp: 98.5 F (36.9  C) 99 F (37.2 C) (!) 97.5 F (36.4 C) 97.6 F (36.4 C)  TempSrc: Oral Oral Oral Oral  SpO2: 96% 100% 100% 98%  Weight:      Height:        General exam: Appears calm and comfortable.   The results of significant diagnostics from this hospitalization (including imaging, microbiology, ancillary and laboratory) are listed below for reference.     Procedures and Diagnostic Studies:   CT Abdomen Pelvis W Contrast  Result Date: 11/01/2021 CLINICAL DATA:  Left lower quadrant pain EXAM: CT ABDOMEN AND PELVIS WITH CONTRAST TECHNIQUE: Multidetector CT imaging of the abdomen and pelvis was performed using the standard protocol following bolus administration of intravenous contrast. CONTRAST:  136mL OMNIPAQUE IOHEXOL 300 MG/ML  SOLN COMPARISON:  None. FINDINGS: Lower chest: No acute abnormality. Hepatobiliary: No focal liver abnormality is seen. No gallstones, gallbladder wall thickening, or biliary dilatation. Pancreas: Unremarkable. No pancreatic ductal dilatation or surrounding inflammatory changes. Spleen: Normal in size without focal abnormality. Adrenals/Urinary Tract: Adrenal glands are unremarkable. Kidneys are normal, without renal calculi, focal lesion, or hydronephrosis. Bladder is slightly thick walled but is under distended Stomach/Bowel: Stomach nonenlarged. Negative appendix. No dilated small bowel. Diverticular disease of left colon. Focal wall thickening at the distal descending and proximal sigmoid colon with soft tissue stranding consistent with acute diverticulitis. No abscess. Tiny focus of gas in the region of left lower quadrant stranding, indeterminate for focus of contained perforation. Vascular/Lymphatic: Mild aortic atherosclerosis. No aneurysm. No suspicious lymph nodes. Reproductive: Prostate is unremarkable.  Other: Negative for pelvic effusion or free air. Small fat containing left inguinal hernia Musculoskeletal: No acute or significant osseous findings. IMPRESSION: 1.  Findings consistent with acute diverticulitis involving the distal descending and proximal sigmoid colon. Possible tiny focus of contained perforation at the site of inflammation. No abscess. Electronically Signed   By: Donavan Foil M.D.   On: 11/01/2021 21:29     Labs:   Basic Metabolic Panel: Recent Labs  Lab 11/01/21 1759 11/02/21 0527 11/03/21 0956 11/04/21 0144  NA 137 134* 131* 134*  K 4.5 3.9 4.2 3.8  CL 102 102 101 102  CO2 23 25 22 24   GLUCOSE 96 106* 102* 91  BUN 18 15 11 9   CREATININE 0.95 0.87 0.89 0.95  CALCIUM 9.3 8.6* 8.9 8.4*   GFR Estimated Creatinine Clearance: 109.8 mL/min (by C-G formula based on SCr of 0.95 mg/dL). Liver Function Tests: Recent Labs  Lab 11/01/21 1759 11/02/21 0527 11/03/21 0956  AST 34 15 15  ALT 16 14 15   ALKPHOS 82 84 67  BILITOT 1.5* 1.4* 0.9  PROT 7.0 6.8 6.7  ALBUMIN 4.2 3.7 3.6   Recent Labs  Lab 11/01/21 1759  LIPASE 31   No results for input(s): AMMONIA in the last 168 hours. Coagulation profile No results for input(s): INR, PROTIME in the last 168 hours.  CBC: Recent Labs  Lab 11/01/21 1759 11/02/21 0527 11/03/21 0956 11/04/21 0144  WBC 14.3* 12.7* 14.6* 13.3*  NEUTROABS 11.1*  --   --   --   HGB 14.9 13.9 14.2 12.8*  HCT 43.4 40.9 43.0 38.2*  MCV 91.2 90.9 91.9 92.0  PLT 279 238 249 239   Cardiac Enzymes: No results for input(s): CKTOTAL, CKMB, CKMBINDEX, TROPONINI in the last 168 hours. BNP: Invalid input(s): POCBNP CBG: No results for input(s): GLUCAP in the last 168 hours. D-Dimer No results for input(s): DDIMER in the last 72 hours. Hgb A1c No results for input(s): HGBA1C in the last 72 hours. Lipid Profile No results for input(s): CHOL, HDL, LDLCALC, TRIG, CHOLHDL, LDLDIRECT in the last 72 hours. Thyroid function studies No results for input(s): TSH, T4TOTAL, T3FREE, THYROIDAB in the last 72 hours.  Invalid input(s): FREET3 Anemia work up No results for input(s): VITAMINB12, FOLATE,  FERRITIN, TIBC, IRON, RETICCTPCT in the last 72 hours. Microbiology Recent Results (from the past 240 hour(s))  Resp Panel by RT-PCR (Flu A&B, Covid) Nasopharyngeal Swab     Status: None   Collection Time: 11/02/21  2:23 AM   Specimen: Nasopharyngeal Swab; Nasopharyngeal(NP) swabs in vial transport medium  Result Value Ref Range Status   SARS Coronavirus 2 by RT PCR NEGATIVE NEGATIVE Final    Comment: (NOTE) SARS-CoV-2 target nucleic acids are NOT DETECTED.  The SARS-CoV-2 RNA is generally detectable in upper respiratory specimens during the acute phase of infection. The lowest concentration of SARS-CoV-2 viral copies this assay can detect is 138 copies/mL. A negative result does not preclude SARS-Cov-2 infection and should not be used as the sole basis for treatment or other patient management decisions. A negative result may occur with  improper specimen collection/handling, submission of specimen other than nasopharyngeal swab, presence of viral mutation(s) within the areas targeted by this assay, and inadequate number of viral copies(<138 copies/mL). A negative result must be combined with clinical observations, patient history, and epidemiological information. The expected result is Negative.  Fact Sheet for Patients:  EntrepreneurPulse.com.au  Fact Sheet for Healthcare Providers:  IncredibleEmployment.be  This test is no t yet  approved or cleared by the [place] and  has been authorized for detection and/or diagnosis of SARS-CoV-2 by FDA under an Emergency Use Authorization (EUA). This EUA will remain  in effect (meaning this test can be used) for the duration of the COVID-19 declaration under Section 564(b)(1) of the Act, 21 U.S.C.section 360bbb-3(b)(1), unless the authorization is terminated  or revoked sooner.       Influenza A by PCR NEGATIVE NEGATIVE Final   Influenza B by PCR NEGATIVE NEGATIVE Final    Comment:  (NOTE) The Xpert Xpress SARS-CoV-2/FLU/RSV plus assay is intended as an aid in the diagnosis of influenza from Nasopharyngeal swab specimens and should not be used as a sole basis for treatment. Nasal washings and aspirates are unacceptable for Xpert Xpress SARS-CoV-2/FLU/RSV testing.  Fact Sheet for Patients: EntrepreneurPulse.com.au  Fact Sheet for Healthcare Providers: IncredibleEmployment.be  This test is not yet approved or cleared by the [place] FDA and has been authorized for detection and/or diagnosis of SARS-CoV-2 by FDA under an Emergency Use Authorization (EUA). This EUA will remain in effect (meaning this test can be used) for the duration of the COVID-19 declaration under Section 564(b)(1) of the Act, 21 U.S.C. section 360bbb-3(b)(1), unless the authorization is terminated or revoked.  Performed at [facility], [place] [address] [place], [place] [address]      Discharge Instructions:   Discharge Instructions     Diet general   Complete by: As directed    Increase activity slowly   Complete by: As directed       Allergies as of 11/05/2021   No Known Allergies      Medication List     STOP taking these medications    diclofenac sodium 1 % Gel Commonly known as: VOLTAREN   Flovent HFA 220 MCG/ACT inhaler Generic drug: fluticasone       TAKE these medications    amoxicillin-clavulanate 875-125 MG tablet Commonly known as: AUGMENTIN Take 1 tablet by mouth every 12 (twelve) hours.   HYDROcodone-acetaminophen 5-325 MG tablet Commonly known as: NORCO/VICODIN Take 1-2 tablets by mouth every 4 (four) hours as needed for moderate pain.   pantoprazole 40 MG tablet Commonly known as: PROTONIX Take 1 tablet (40 mg total) by mouth daily.   rosuvastatin 5 MG tablet Commonly known as: CRESTOR Take 5 mg by mouth daily.        Follow-up Information     Avva, Ravisankar, MD. Schedule an appointment  as soon as possible for a visit.   Specialty: Internal Medicine Why: As needed Contact information: [place] [address] [phone]         Ladene Artist, MD Follow up in 6 week(s).   Specialty: Gastroenterology Why: for colonoscopy Contact information: [address] [place] [address] [phone]                  Time coordinating discharge: 35 min  Signed:  Geradine Girt DO  [facility] 11/05/2021, 9:56 AM

## 2021-11-05 NOTE — Progress Notes (Signed)
D/c instructions reviewed with patient, verbalized understanding. IV removed. Patient ambulated to private vehicle accompanied by this nurse.

## 2022-01-23 ENCOUNTER — Ambulatory Visit (INDEPENDENT_AMBULATORY_CARE_PROVIDER_SITE_OTHER): Payer: 59 | Admitting: Gastroenterology

## 2022-01-23 ENCOUNTER — Encounter: Payer: Self-pay | Admitting: Gastroenterology

## 2022-01-23 VITALS — BP 122/88 | HR 76 | Ht 73.0 in | Wt 223.0 lb

## 2022-01-23 DIAGNOSIS — K5792 Diverticulitis of intestine, part unspecified, without perforation or abscess without bleeding: Secondary | ICD-10-CM | POA: Diagnosis not present

## 2022-01-23 DIAGNOSIS — R933 Abnormal findings on diagnostic imaging of other parts of digestive tract: Secondary | ICD-10-CM | POA: Diagnosis not present

## 2022-01-23 MED ORDER — PLENVU 140 G PO SOLR: 1.0000 | Freq: Once | ORAL | 0 refills | Status: AC

## 2022-01-23 NOTE — Patient Instructions (Signed)
You have been scheduled for a colonoscopy. Please follow written instructions given to you at your visit today.  Please pick up your prep supplies at the pharmacy within the next 1-3 days. If you use inhalers (even only as needed), please bring them with you on the day of your procedure.  The [facility] providers would like to encourage you to use MYCHART to communicate with providers for non-urgent requests or questions.  Due to long hold times on the telephone, sending your provider a message by MYCHART may be a faster and more efficient way to get a response.  Please allow 48 business hours for a response.  Please remember that this is for non-urgent requests.   Due to recent changes in healthcare laws, you may see the results of your imaging and laboratory studies on MyChart before your provider has had a chance to review them.  We understand that in some cases there may be results that are confusing or concerning to you. Not all laboratory results come back in the same time frame and the provider may be waiting for multiple results in order to interpret others.  Please give us 48 hours in order for your provider to thoroughly review all the results before contacting the office for clarification of your results.   Thank you for choosing me and [facility].  Malcolm T. Stark, Jr., MD., FACG  

## 2022-01-23 NOTE — Progress Notes (Signed)
? ? ?  History of Present Illness: This is a 46 year old male returning for follow-up of diverticulitis.  He was hospitalized with acute diverticulitis in mid January.  CT findings as below.  He was treated with IV Unasyn and then changed to Augmentin.  He completed the course of antibiotics as an outpatient.  His abdominal pain completely resolved after several days.  He has begun taking Metamucil daily.  His bowel movements have been normal without constipation or straining.  He has no gastrointestinal complaints today. ? ?CT AP 11/01/2021 ?Findings consistent with acute diverticulitis involving the distal descending and proximal sigmoid colon. Possible tiny focus of contained perforation at the site of inflammation. No abscess ? ?Current Medications, Allergies, Past Medical History, Past Surgical History, Family History and Social History were reviewed in [employer] record. ? ? ?Physical Exam: ?General: Well developed, well nourished, no acute distress ?Head: Normocephalic and atraumatic ?Eyes: Sclerae anicteric, EOMI ?Ears: Normal auditory acuity ?Mouth: Not examined, mask on during Covid-19 pandemic ?Lungs: Clear throughout to auscultation ?Heart: Regular rate and rhythm; no murmurs, rubs or bruits ?Abdomen: Soft, non tender and non distended. No masses, hepatosplenomegaly or hernias noted. Normal Bowel sounds ?Rectal: Deferred to colonoscopy  ?Musculoskeletal: Symmetrical with no gross deformities  ?Pulses:  Normal pulses noted ?Extremities: No clubbing, cyanosis, edema or deformities noted ?Neurological: Alert oriented x 4, grossly nonfocal ?Psychological:  Alert and cooperative. Normal mood and affect ? ? ?Assessment and Recommendations: ? ?Acute diverticulitis with microperforation, resolved.  Abnormal CT of the colon due to acute diverticulitis.  Continue high-fiber diet with adequate daily water intake.  Metamucil or other brand of fiber supplement per patient preference.  Rule out  neoplasm, IBD with colonoscopy.  Schedule colonoscopy. The risks (including bleeding, perforation, infection, missed lesions, medication reactions and possible hospitalization or surgery if complications occur), benefits, and alternatives to colonoscopy with possible biopsy and possible polypectomy were discussed with the patient and they consent to proceed.   ? EoE with esophageal strictures.  EGD with dilation performed in July 2021.  He is currently not on any medications and is asymptomatic.  Will observe off medication for now. ?

## 2022-02-13 ENCOUNTER — Other Ambulatory Visit: Payer: Self-pay

## 2022-02-13 ENCOUNTER — Telehealth: Payer: Self-pay | Admitting: Gastroenterology

## 2022-02-13 MED ORDER — AMOXICILLIN-POT CLAVULANATE 875-125 MG PO TABS: 1.0000 | ORAL_TABLET | Freq: Two times a day (BID) | ORAL | 0 refills | Status: DC

## 2022-02-13 NOTE — Telephone Encounter (Signed)
Augmentin 875 mg po bid with food, #20, no refills ?Light diet until symptoms resolve  ?

## 2022-02-13 NOTE — Telephone Encounter (Signed)
Patient called in with complaints of a dull LLQ abdominal pain that started last night. Pain level 2/10. He says it feels similar to the pain he experienced in January when he was admitted with Acute Diverticulitis. No other symptoms, and currently having normal bowel movements. He was last seen with Dr. Fuller Plan for an OV on 01/23/22 and says he was told to call back for antibiotics if symptoms reoccur. He is leaving town tomorrow, and is hoping to feel better soon. Will route to Dr. Fuller Plan. ?

## 2022-02-13 NOTE — Telephone Encounter (Signed)
Patient called said he thinks he might have Diverticulitis again and would like to discuss to possibly get some antibiotics.  ?

## 2022-02-13 NOTE — Telephone Encounter (Signed)
Spoke with patient regarding medication & recommendations. No further questions. ?

## 2022-03-13 ENCOUNTER — Encounter: Payer: 59 | Admitting: Gastroenterology

## 2022-04-10 ENCOUNTER — Encounter: Payer: Self-pay | Admitting: Gastroenterology

## 2022-04-16 ENCOUNTER — Encounter: Payer: Self-pay | Admitting: Gastroenterology

## 2022-04-16 ENCOUNTER — Ambulatory Visit (AMBULATORY_SURGERY_CENTER): Payer: 59 | Admitting: Gastroenterology

## 2022-04-16 VITALS — BP 99/61 | HR 63 | Temp 98.3°F | Resp 13 | Ht 73.0 in | Wt 223.0 lb

## 2022-04-16 DIAGNOSIS — D120 Benign neoplasm of cecum: Secondary | ICD-10-CM

## 2022-04-16 DIAGNOSIS — R933 Abnormal findings on diagnostic imaging of other parts of digestive tract: Secondary | ICD-10-CM | POA: Diagnosis not present

## 2022-04-16 DIAGNOSIS — K5792 Diverticulitis of intestine, part unspecified, without perforation or abscess without bleeding: Secondary | ICD-10-CM | POA: Diagnosis present

## 2022-04-16 MED ORDER — SODIUM CHLORIDE 0.9 % IV SOLN: 500.0000 mL | INTRAVENOUS | Status: DC

## 2022-04-16 NOTE — Progress Notes (Signed)
PT taken to PACU. Monitors in place. VSS. Report given to RN. 

## 2022-04-17 ENCOUNTER — Telehealth: Payer: Self-pay | Admitting: *Deleted

## 2022-04-17 NOTE — Telephone Encounter (Signed)
Left message on f/u call 

## 2022-04-28 ENCOUNTER — Encounter: Payer: Self-pay | Admitting: Gastroenterology

## 2022-06-24 IMAGING — CT CT ABD-PELV W/ CM
2 of 5 series · 16 of 46 positions shown, 18 images · IV contrast (Omni 300)
Comparison: None.

CLINICAL DATA: Left lower quadrant pain

EXAM:
CT ABDOMEN AND PELVIS WITH CONTRAST
TECHNIQUE: Multidetector CT imaging of the abdomen and pelvis was performed
using the standard protocol following bolus administration of
intravenous contrast.
CONTRAST:  100mL OMNIPAQUE IOHEXOL 300 MG/ML  SOLN

[Series 3: a/p w/ 5mm · axial · 0.89mm/px · z∈[+856,+1336]mm · 13 of 108 slices shown, 15 images]
[im 6/108  soft-tissue]
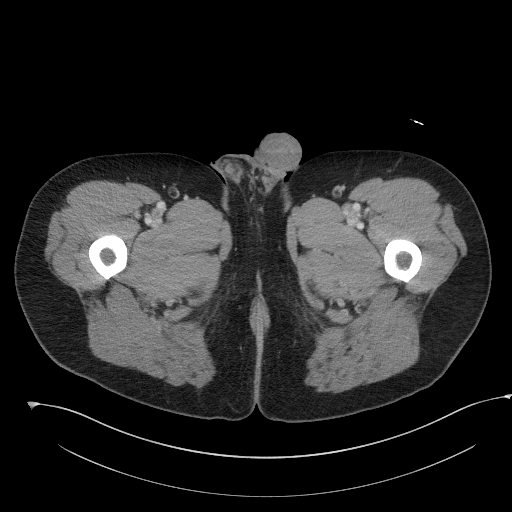
[im 6/108  bone]
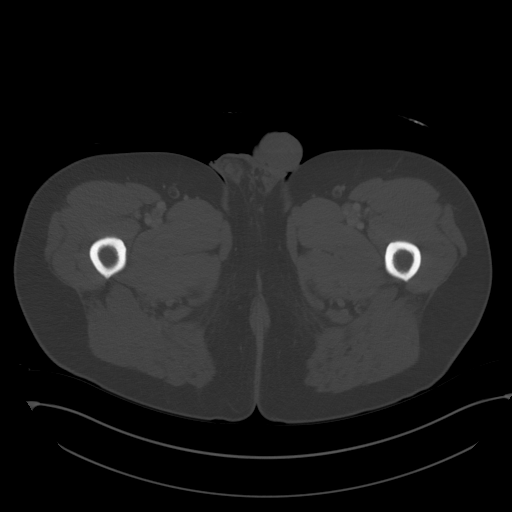
[im 17/108  soft-tissue]
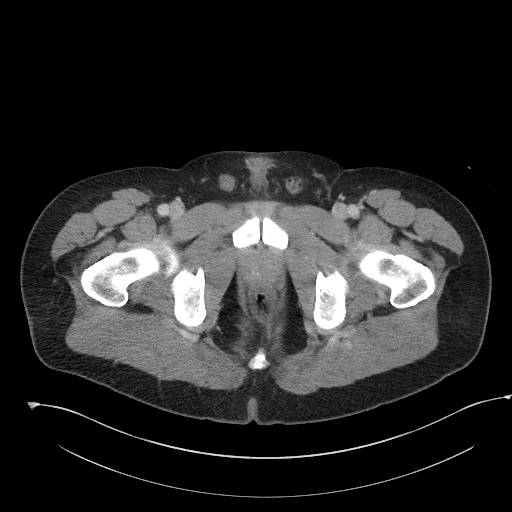
[im 23/108  soft-tissue]
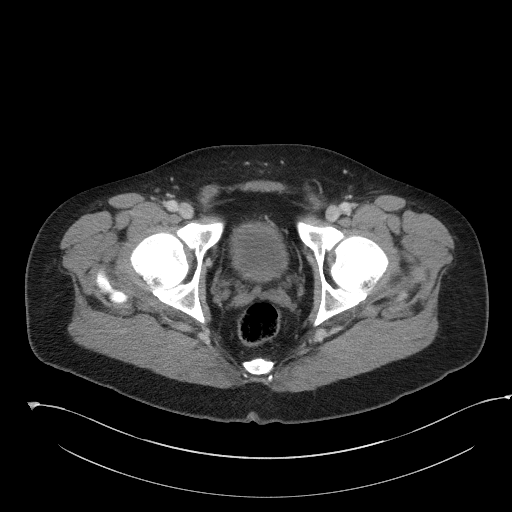
[im 29/108  soft-tissue]
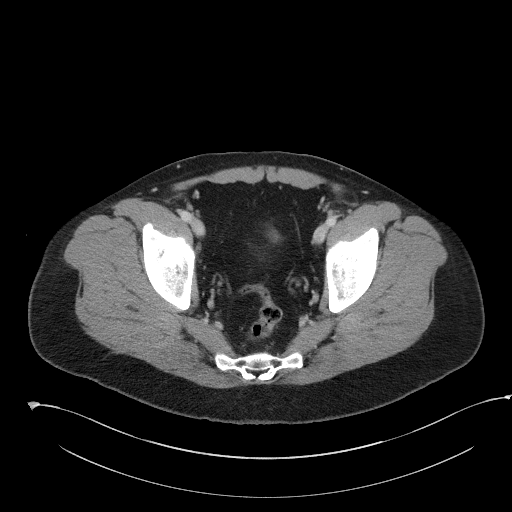
[im 40/108  soft-tissue]
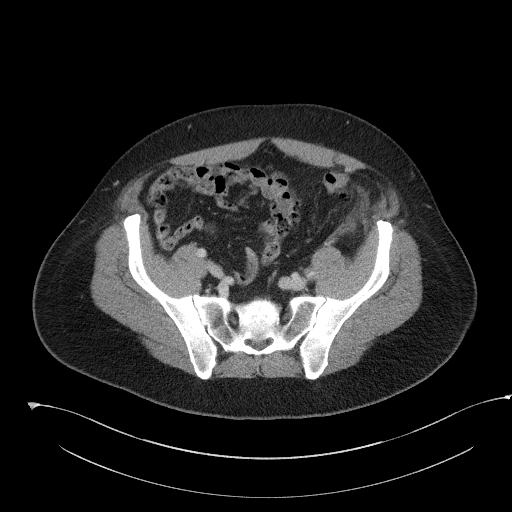
[im 46/108  soft-tissue]
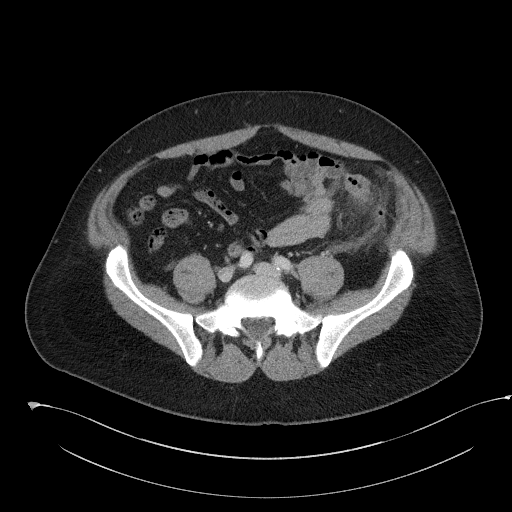
[im 57/108  soft-tissue]
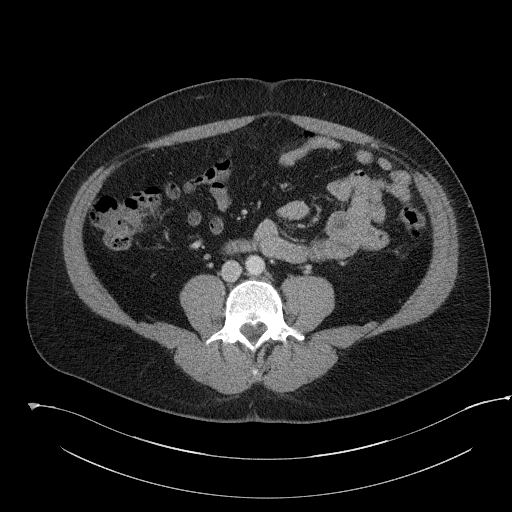
[im 62/108  soft-tissue]
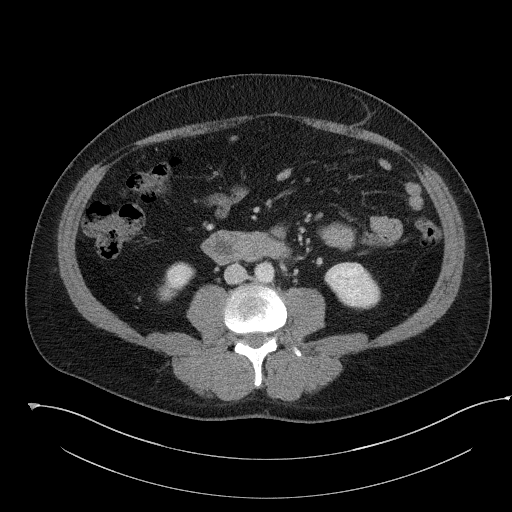
[im 68/108  soft-tissue]
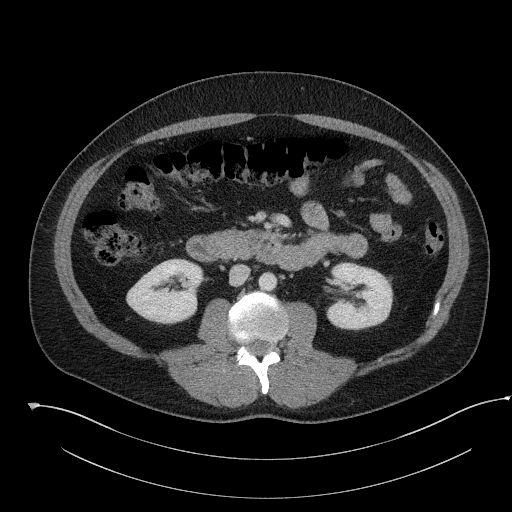
[im 68/108  bone]
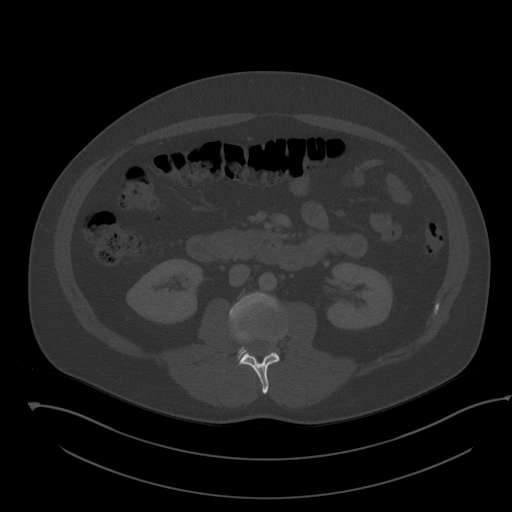
[im 79/108  soft-tissue]
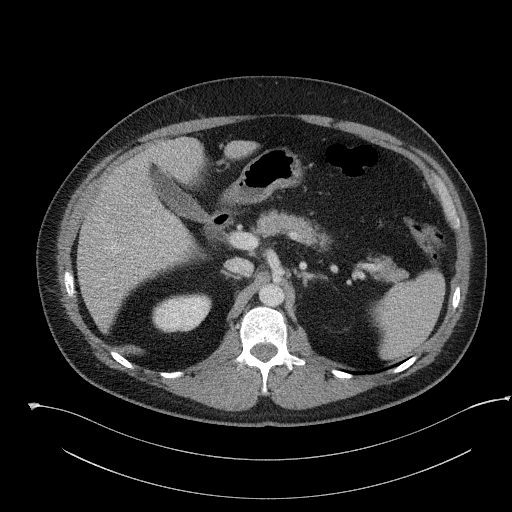
[im 85/108  soft-tissue]
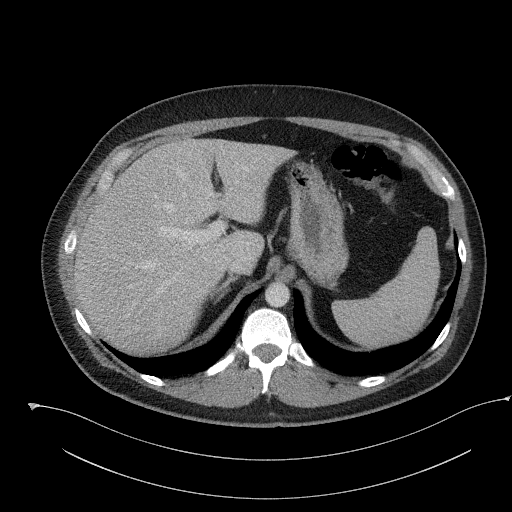
[im 91/108  soft-tissue]
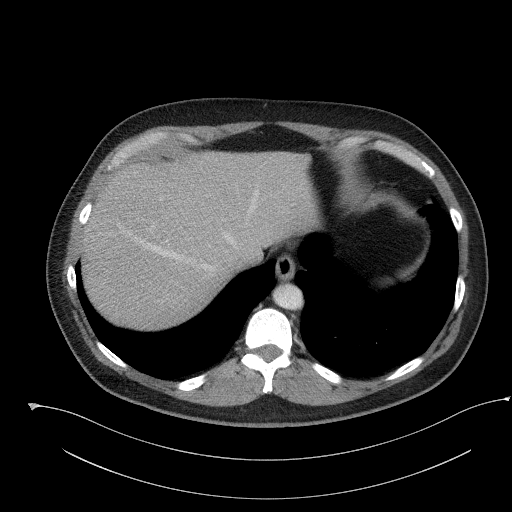
[im 102/108  soft-tissue]
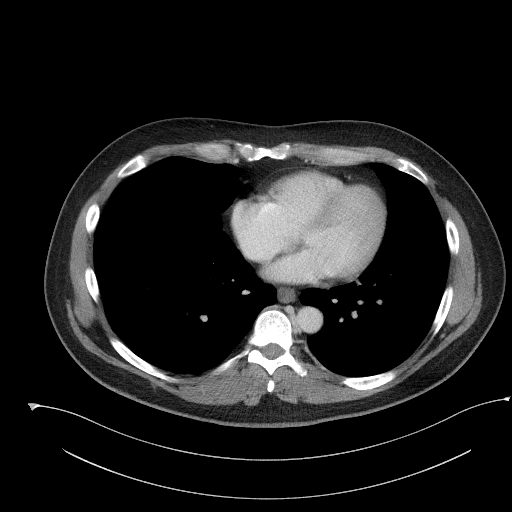

[Series 6: a/p w/ cor · coronal · 0.95mm/px · 3 of 160 slices shown]
[im 54/160  soft-tissue]
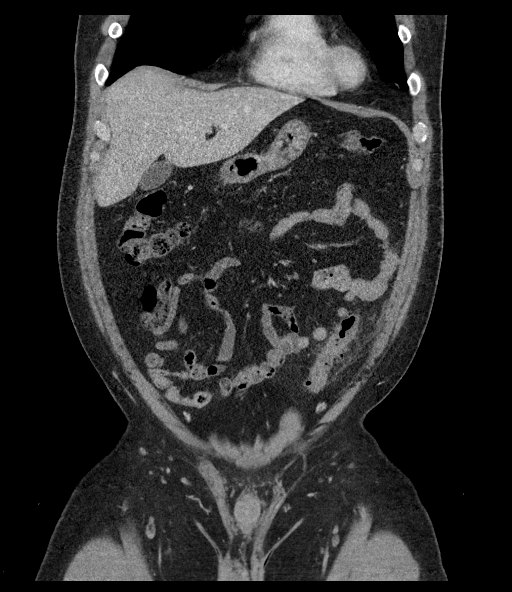
[im 71/160  soft-tissue]
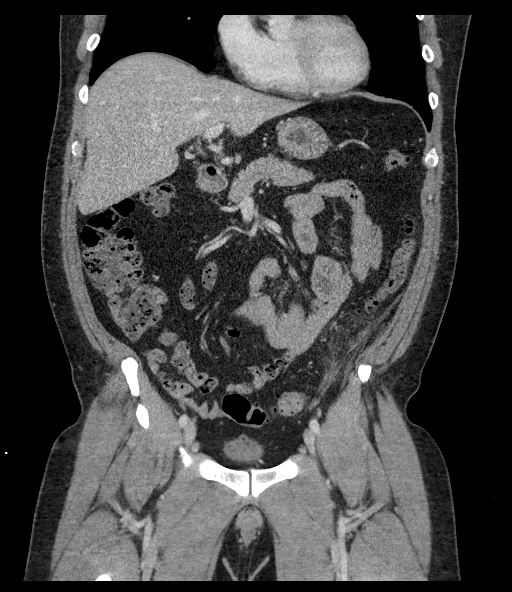
[im 89/160  soft-tissue]
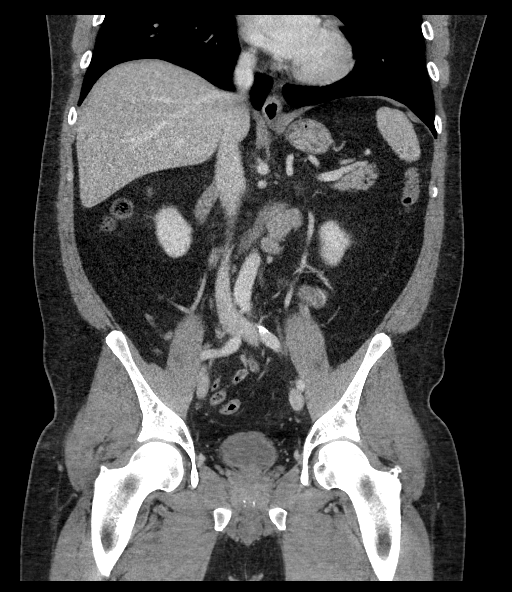

[16 of 46 positions shown; findings below may reference images not displayed]

FINDINGS: Lower chest: No acute abnormality.

Hepatobiliary: No focal liver abnormality is seen. No gallstones,
gallbladder wall thickening, or biliary dilatation.

Pancreas: Unremarkable. No pancreatic ductal dilatation or
surrounding inflammatory changes.

Spleen: Normal in size without focal abnormality.

Adrenals/Urinary Tract: Adrenal glands are unremarkable. Kidneys are
normal, without renal calculi, focal lesion, or hydronephrosis.
Bladder is slightly thick walled but is under distended

Stomach/Bowel: Stomach nonenlarged. Negative appendix. No dilated
small bowel. Diverticular disease of left colon. Focal wall
thickening at the distal descending and proximal sigmoid colon with
soft tissue stranding consistent with acute diverticulitis. No
abscess. Tiny focus of gas in the region of left lower quadrant
stranding, indeterminate for focus of contained perforation.

Vascular/Lymphatic: Mild aortic atherosclerosis. No aneurysm. No
suspicious lymph nodes.

Reproductive: Prostate is unremarkable.

Other: Negative for pelvic effusion or free air. Small fat
containing left inguinal hernia

Musculoskeletal: No acute or significant osseous findings.
IMPRESSION: 1. Findings consistent with acute diverticulitis involving the
distal descending and proximal sigmoid colon. Possible tiny focus of
contained perforation at the site of inflammation. No abscess.

## 2022-10-27 ENCOUNTER — Telehealth: Payer: Self-pay | Admitting: Physician Assistant

## 2022-10-27 DIAGNOSIS — K5792 Diverticulitis of intestine, part unspecified, without perforation or abscess without bleeding: Secondary | ICD-10-CM

## 2022-10-27 MED ORDER — AMOXICILLIN-POT CLAVULANATE 875-125 MG PO TABS: 1.0000 | ORAL_TABLET | Freq: Two times a day (BID) | ORAL | 0 refills | Status: DC

## 2022-10-27 MED ORDER — DICYCLOMINE HCL 20 MG PO TABS: 20.0000 mg | ORAL_TABLET | Freq: Three times a day (TID) | ORAL | 0 refills | Status: DC | PRN

## 2022-10-27 NOTE — Telephone Encounter (Signed)
Patient of Dr. Fuller Plan Diverticulitis Flare 01/2022 Colon 04/16/2022 7 ,mm polyp, right sided diverticulosis  Midnight last night, sharp LLQ AB pain started. If he sits still dull pain but with movement worse.  States identical to pain previously.  Has had normal Bm's, no BM today. Denies fever, chills.  No nausea or vomiting.  He is hungry but has not had anything to eat.  Leaving the country in a 1-2 weeks, going to [place] for 4 days.   Suspicious for diverticulitis/colitis with history and physical exam, patient hemodynamically stable -IBGARD daily, will give Bentyl as needed, heating pad and liquid diet. - Prescribed Augmentin - ER precautions discussed with the patient -close follow up 4 weeks -If patient continues to have diverticular flares with right-sided disease, can consider referral to general surgery for evaluation.

## 2022-10-28 NOTE — Telephone Encounter (Signed)
Spoke with patient & he stated he is starting to show some improvement in his symptoms. He's been scheduled for follow up on 12/16/22 at 10:50 am with Dr. Fuller Plan & has been advised to call back before then if symptoms persist or worsen.

## 2022-11-04 ENCOUNTER — Other Ambulatory Visit: Payer: Self-pay

## 2022-11-04 MED ORDER — AMOXICILLIN-POT CLAVULANATE 875-125 MG PO TABS: 1.0000 | ORAL_TABLET | Freq: Two times a day (BID) | ORAL | 0 refills | Status: DC

## 2022-11-04 NOTE — Telephone Encounter (Signed)
Inbound call from patient stating that he had been put on Augmentin for a flare up and tomorrow will be his last day. Patient stated that he will be going out of the country and is wanting to know if there is anyway he can get a refill just in case he has a flare up while he is gone. Please advise.

## 2022-11-04 NOTE — Telephone Encounter (Signed)
Spoke with patient & he states he is feeling much better, but will be traveling and wanted to have a refill for medication just in case of a flare. Refill sent to pharmacy.

## 2022-12-16 ENCOUNTER — Ambulatory Visit: Payer: 59 | Admitting: Gastroenterology

## 2022-12-16 ENCOUNTER — Encounter: Payer: Self-pay | Admitting: Gastroenterology

## 2022-12-16 VITALS — BP 120/80 | HR 65 | Ht 73.0 in | Wt 220.5 lb

## 2022-12-16 DIAGNOSIS — K5792 Diverticulitis of intestine, part unspecified, without perforation or abscess without bleeding: Secondary | ICD-10-CM | POA: Diagnosis not present

## 2022-12-16 NOTE — Patient Instructions (Signed)
Continue fiber supplement daily.   Decrease red meat and increase exercise.   Diverticulosis  Diverticulosis is when small pouches called diverticula form in the wall of the colon. The colon is where water is absorbed. It is also where poop (stool) is formed. The pouches form when the inside layer of the colon pushes through weak spots in the outer layers of the colon. You may have a few pouches or many of them. In most cases, the pouches do not cause problems. If they become inflamed or infected, you may have a condition called diverticulitis. What are the causes? The cause of this condition is not known. What increases the risk? You are more likely to get this condition if: You are older than 48 years of age. You do not eat enough fiber or you get constipated a lot. You are overweight. You do not get enough exercise. You smoke. You take over-the-counter pain medicines. You have a family history of the condition. What are the signs or symptoms? In most people, there are no symptoms. If you do have symptoms, they may include: Bloating. Stomach cramps. Constipation or diarrhea. Pain in the lower left side of your abdomen. How is this diagnosed? This condition is often diagnosed during an exam for other colon problems. It may be diagnosed when you have: A colonoscopy. This is when a tube with a camera on the end is used to look at your colon. A barium enema. This is an X-ray exam that uses dye to look at your colon. A CT scan. How is this treated? You may not need treatment. Your health care provider will tell you what you can do at home to help prevent problems. You may need treatment if you have symptoms or if you have had diverticulitis before. You may be told to: Eat a high-fiber diet. Take medicine to relax your colon. Lose weight. Follow these instructions at home: Medicines Take over-the-counter and prescription medicines only as told by your provider. If told, take a fiber  supplement or probiotic. Managing constipation Your condition may cause constipation. To prevent or treat constipation, you may need to: Drink enough fluid to keep your pee (urine) pale yellow. Take over-the-counter or prescription medicines. Eat foods that are high in fiber, such as beans, whole grains, and fresh fruits and vegetables. Limit foods that are high in fat and processed sugars, such as fried or sweet foods. Try not to strain when you poop. Contact a health care provider if: Your symptoms get worse all of a sudden. You have pain in your abdomen that gets worse. You have bloating or stomach cramps. You continue to have frequent constipation. You have a fever or chills. You vomit. Your poop is bloody, black, or tarry. This information is not intended to replace advice given to you by your health care provider. Make sure you discuss any questions you have with your health care provider. Document Revised: 07/04/2022 Document Reviewed: 07/04/2022 Elsevier Patient Education  [place] you for choosing me and [facility].  Pricilla Riffle. Dagoberto Ligas., MD., [facility] Regal

## 2022-12-16 NOTE — Progress Notes (Signed)
    Assessment     Recurrent diverticulitis Personal history of adenomatous colon polyps History of gastric ulcers and erosive gastritis felt secondary to NSAIDs GERD with history of esophageal stricture, endoscopic appearance concerning for EoE however biopsies did not confirm   Recommendations    Discussed continuing to avoid NSAIDs, decreasing meat intake, continuing daily fiber supplements and increasing exercise.  We discussed colorectal surgery referral for consideration of segmental colectomy if recurrent episodes persist. Surveillance colonoscopy recommended in June 2030   HPI    This is a 47 year old male with recurrent diverticulitis.  He was treated with a course of Augmentin for recurrent diverticulitis in January.  He states his symptoms resolved nicely.  He has had 3 episodes of diverticulitis over the past 13 months.  All have been uncomplicated.  The first required hospitalization.  He has avoided NSAIDs for years with a history of gastric ulcers in 2011.  He takes fiber supplement daily.  He has regular formed bowel movements without straining.  He denies reflux symptoms and dysphagia.  Colonoscopy June 2023: External skin tags 7 mm polyp at ileocecal valve, removed with a cold snare and retrieved (tubular adenoma) Mild diverticulosis in the right colon Moderate diverticulosis in the left colon Otherwise normal exam on direct and retroflexed views   Labs / Imaging       Latest Ref Rng & Units 11/03/2021    9:56 AM 11/02/2021    5:27 AM 11/01/2021    5:59 PM  Hepatic Function  Total Protein 6.5 - 8.1 g/dL 6.7  6.8  7.0   Albumin 3.5 - 5.0 g/dL 3.6  3.7  4.2   AST 15 - 41 U/L 15  15  34   ALT 0 - 44 U/L 15  14  16   $ Alk Phosphatase 38 - 126 U/L 67  84  82   Total Bilirubin 0.3 - 1.2 mg/dL 0.9  1.4  1.5   Bilirubin, Direct 0.0 - 0.2 mg/dL  0.2         Latest Ref Rng & Units 11/04/2021    1:44 AM 11/03/2021    9:56 AM 11/02/2021    5:27 AM  CBC  WBC 4.0 -  10.5 K/uL 13.3  14.6  12.7   Hemoglobin 13.0 - 17.0 g/dL 12.8  14.2  13.9   Hematocrit 39.0 - 52.0 % 38.2  43.0  40.9   Platelets 150 - 400 K/uL 239  249  238     Current Medications, Allergies, Past Medical History, Past Surgical History, Family History and Social History were reviewed in [employer] record.   Physical Exam: General: Well developed, well nourished, no acute distress Head: Normocephalic and atraumatic Eyes: Sclerae anicteric, EOMI Ears: Normal auditory acuity Mouth: No deformities or lesions noted Lungs: Clear throughout to auscultation Heart: Regular rate and rhythm; No murmurs, rubs or bruits Abdomen: Soft, non tender and non distended. No masses, hepatosplenomegaly or hernias noted. Normal Bowel sounds Rectal: Not done Musculoskeletal: Symmetrical with no gross deformities  Pulses:  Normal pulses noted Extremities: No edema or deformities noted Neurological: Alert oriented x 4, grossly nonfocal Psychological:  Alert and cooperative. Normal mood and affect   Larry Pratt Plan, MD 12/16/2022, 10:53 AM

## 2024-01-06 ENCOUNTER — Telehealth: Payer: Self-pay | Admitting: Gastroenterology

## 2024-01-06 NOTE — Telephone Encounter (Signed)
 PT is having a diverticulitis flare and would like to have a medication called in for him. Requesting to speak to a nurse. Please advise.

## 2024-01-06 NOTE — Telephone Encounter (Signed)
 FYI Dr Rhea Belton-    He is a previous Dr Russella Dar pt who has chosen to see Dr Rhea Belton now.  He is going to call back after his meeting at work to set up the appt.  Spoke with the pt and he tells me that he is having a diverticulitis flare that started yesterday. He was given a prescription by Dr Russella Dar to have on hand in case of a flare when traveling so he began that yesterday.  He has begun to feel better.    He is going to finish the abx and will let us know  if he has any issues before the appt with Dr Rhea Belton.

## 2024-01-07 NOTE — Telephone Encounter (Signed)
 Ok. Thanks!

## 2024-02-18 ENCOUNTER — Ambulatory Visit: Admitting: Gastroenterology

## 2024-03-10 NOTE — Progress Notes (Signed)
 Chief Complaint: Reestablish care Primary GI MD: Dr. Sandrea Cruel  HPI: Discussed the use of AI scribe software for clinical note transcription with the patient, who gave verbal consent to proceed.  History of Present Illness Larry Pratt "Larry Pratt" is a 48 year old male who presents for follow-up of diverticulitis.  Recommended to reestablish care after Dr. Emaline Handsome retirement  He has experienced four episodes of diverticulitis, with the first occurring in January 2023, which resulted in a microperforation and required hospitalization for IV antibiotics. Subsequent episodes were managed with Augmentin  at home. His last episode was in March 2025, for which he self-initiated Augmentin  treatment using a previously filled prescription. He has not experienced any episodes since then.  A colonoscopy was performed, revealing diverticulosis but was otherwise unremarkable. He maintains a high-fiber diet and takes a daily fiber supplement, specifically Metamucil, to manage his condition.  During a trip to the [place], he carried Augmentin  with him, although he did not need to use it. He is planning a trip to [place], [place] , in four weeks.   PREVIOUS GI WORKUP   Colonoscopy June 2023: External skin tags 7 mm polyp at ileocecal valve, removed with a cold snare and retrieved (tubular adenoma) Mild diverticulosis in the right colon Moderate diverticulosis in the left colon Otherwise normal exam on direct and retroflexed views   Past Medical History:  Diagnosis Date   Diverticulitis    History of stomach ulcers    Hyperlipidemia     Past Surgical History:  Procedure Laterality Date   APPENDECTOMY     BIOPSY  03/31/2020   Procedure: BIOPSY;  Surgeon: Asencion Blacksmith, MD;  Location[facility] ENDOSCOPY;  Service: Endoscopy;;   ESOPHAGOGASTRODUODENOSCOPY (EGD) WITH PROPOFOL  N/A 03/31/2020   Procedure: ESOPHAGOGASTRODUODENOSCOPY (EGD) WITH PROPOFOL ;  Surgeon: Asencion Blacksmith, MD;   Location: [facility] ENDOSCOPY;  Service: Endoscopy;  Laterality: N/A;   HERNIA REPAIR     IMPACTION REMOVAL  03/31/2020   Procedure: IMPACTION REMOVAL;  Surgeon: Asencion Blacksmith, MD;  Location: [facility] ENDOSCOPY;  Service: Endoscopy;;   UPPER GASTROINTESTINAL ENDOSCOPY      Current Outpatient Medications  Medication Sig Dispense Refill   amoxicillin -clavulanate (AUGMENTIN ) 875-125 MG tablet Take 1 tablet by mouth 2 (two) times daily. 20 tablet 0   Multiple Vitamin (MULTIVITAMIN) tablet Take 1 tablet by mouth daily.     psyllium (METAMUCIL) 58.6 % packet Take 1 packet by mouth daily.     rosuvastatin  (CRESTOR ) 20 MG tablet Take 20 mg by mouth daily.     No current facility-administered medications for this visit.    Allergies as of 03/11/2024   (No Known Allergies)    Family History  Problem Relation Age of Onset   Uterine cancer Mother    Cardiomyopathy Father    Breast cancer Sister    Lung cancer Paternal Grandmother    Colon cancer Neg Hx    Colon polyps Neg Hx    Esophageal cancer Neg Hx    Stomach cancer Neg Hx    Rectal cancer Neg Hx    Pancreatic cancer Neg Hx     Social History   Socioeconomic History   Marital status: Married    Spouse name: Not on file   Number of children: Not on file   Years of education: Not on file   Highest education level: Not on file  Occupational History   Not on file  Tobacco Use   Smoking status: Never   Smokeless tobacco: Never  Vaping  Use   Vaping status: Never Used  Substance and Sexual Activity   Alcohol use: Yes    Alcohol/week: 2.0 standard drinks of alcohol    Types: 2 Shots of liquor per week    Comment: 5-6   Drug use: No   Sexual activity: Not on file  Other Topics Concern   Not on file  Social History Narrative   Iced tea   Social Drivers of Health   Financial Resource Strain: Not on file  Food Insecurity: Not on file  Transportation Needs: Not on file  Physical Activity: Not on file  Stress: Not on file  Social  Connections: Not on file  Intimate Partner Violence: Not on file    Review of Systems:    Constitutional: No weight loss, fever, chills, weakness or fatigue HEENT: Eyes: No change in vision               Ears, Nose, Throat:  No change in hearing or congestion Skin: No rash or itching Cardiovascular: No chest pain, chest pressure or palpitations   Respiratory: No SOB or cough Gastrointestinal: See HPI and otherwise negative Genitourinary: No dysuria or change in urinary frequency Neurological: No headache, dizziness or syncope Musculoskeletal: No new muscle or joint pain Hematologic: No bleeding or bruising Psychiatric: No history of depression or anxiety    Physical Exam:  Vital signs: BP 122/86   Pulse 72   Ht 6\' 1"  (1.854 m)   Wt 229 lb 8 oz (104.1 kg)   SpO2 98%   BMI 30.28 kg/m   Constitutional: NAD, alert and cooperative Head:  Normocephalic and atraumatic. Eyes:   PEERL, EOMI. No icterus. Conjunctiva pink. Respiratory: Respirations even and unlabored. Lungs clear to auscultation bilaterally.   No wheezes, crackles, or rhonchi.  Cardiovascular:  Regular rate and rhythm. No peripheral edema, cyanosis or pallor.  Gastrointestinal:  Soft, nondistended, nontender. No rebound or guarding. Normal bowel sounds. No appreciable masses or hepatomegaly. Rectal:  Declines Msk:  Symmetrical without gross deformities. Without edema, no deformity or joint abnormality.  Neurologic:  Alert and  oriented x4;  grossly normal neurologically.  Skin:   Dry and intact without significant lesions or rashes. Psychiatric: Oriented to person, place and time. Demonstrates good judgement and reason without abnormal affect or behaviors.  RELEVANT LABS AND IMAGING: CBC    Component Value Date/Time   WBC 13.3 (H) 11/04/2021 0144   RBC 4.15 (L) 11/04/2021 0144   HGB 12.8 (L) 11/04/2021 0144   HCT 38.2 (L) 11/04/2021 0144   PLT 239 11/04/2021 0144   MCV 92.0 11/04/2021 0144   MCH 30.8  11/04/2021 0144   MCHC 33.5 11/04/2021 0144   RDW 12.2 11/04/2021 0144   LYMPHSABS 1.7 11/01/2021 1759   MONOABS 1.1 (H) 11/01/2021 1759   EOSABS 0.3 11/01/2021 1759   BASOSABS 0.1 11/01/2021 1759    CMP     Component Value Date/Time   NA 134 (L) 11/04/2021 0144   K 3.8 11/04/2021 0144   CL 102 11/04/2021 0144   CO2 24 11/04/2021 0144   GLUCOSE 91 11/04/2021 0144   BUN 9 11/04/2021 0144   CREATININE 0.95 11/04/2021 0144   CALCIUM  8.4 (L) 11/04/2021 0144   PROT 6.7 11/03/2021 0956   ALBUMIN 3.6 11/03/2021 0956   AST 15 11/03/2021 0956   ALT 15 11/03/2021 0956   ALKPHOS 67 11/03/2021 0956   BILITOT 0.9 11/03/2021 0956   GFRNONAA >60 11/04/2021 0144   GFRAA >90 03/17/2012 1334  Assessment/Plan:   History of diverticulitis For episodes of uncomplicated diverticulitis since January 2023.  Recent episode March 2025 treated with Augmentin  with resolution.  Discussed colorectal surgical referral for consideration of segmental colectomy if recurrent episodes persist.  Patient would prefer to hold off on surgical recommendation for now.  Doing well at this time. - Advised if recurrent diverticulitis symptoms to let us  know - Will send in Augmentin  refill for him to keep on hand for his upcoming travel if needed.  He is advised to call our office if he has to use it - Continue high-fiber diet - Educated patient on diverticulosis versus diverticulitis and provided patient education handouts - Follow-up as needed  Surveillance colonoscopy Colonoscopy June 2023 with 7 mm TA at IC valve, diverticulosis. - repeat June 2030  Assigned to Dr. Bridgett Camps today per patient request  [facility] 03/11/2024, 9:24 AM  Cc: Avva, Ravisankar, MD

## 2024-03-11 ENCOUNTER — Encounter: Payer: Self-pay | Admitting: Gastroenterology

## 2024-03-11 ENCOUNTER — Ambulatory Visit: Admitting: Gastroenterology

## 2024-03-11 VITALS — BP 122/86 | HR 72 | Ht 73.0 in | Wt 229.5 lb

## 2024-03-11 DIAGNOSIS — K5792 Diverticulitis of intestine, part unspecified, without perforation or abscess without bleeding: Secondary | ICD-10-CM

## 2024-03-11 DIAGNOSIS — Z860101 Personal history of adenomatous and serrated colon polyps: Secondary | ICD-10-CM

## 2024-03-11 DIAGNOSIS — Z8719 Personal history of other diseases of the digestive system: Secondary | ICD-10-CM | POA: Diagnosis not present

## 2024-03-11 DIAGNOSIS — Z09 Encounter for follow-up examination after completed treatment for conditions other than malignant neoplasm: Secondary | ICD-10-CM | POA: Diagnosis not present

## 2024-03-11 DIAGNOSIS — D120 Benign neoplasm of cecum: Secondary | ICD-10-CM

## 2024-03-11 MED ORDER — AMOXICILLIN-POT CLAVULANATE 875-125 MG PO TABS: 1.0000 | ORAL_TABLET | Freq: Two times a day (BID) | ORAL | 0 refills | Status: DC

## 2024-03-11 NOTE — Progress Notes (Signed)
 Addendum: Reviewed and agree with assessment and management plan. Asha Grumbine, Carie Caddy, MD

## 2024-03-11 NOTE — Patient Instructions (Signed)
 We have sent the following medications to your pharmacy for you to pick up at your convenience: Augmentin  875 mg twice daily.   _______________________________________________________  If your blood pressure at your visit was 140/90 or greater, please contact your primary care physician to follow up on this.  _______________________________________________________  If you are age 49 or older, your body mass index should be between 23-30. Your Body mass index is 30.28 kg/m. If this is out of the aforementioned range listed, please consider follow up with your Primary Care Provider.  If you are age 23 or younger, your body mass index should be between 19-25. Your Body mass index is 30.28 kg/m. If this is out of the aformentioned range listed, please consider follow up with your Primary Care Provider.   ________________________________________________________  The [facility] providers would like to encourage you to use MYCHART to communicate with providers for non-urgent requests or questions.  Due to long hold times on the telephone, sending your provider a message by [facility] may be a faster and more efficient way to get a response.  Please allow 48 business hours for a response.  Please remember that this is for non-urgent requests.  _______________________________________________________   It was a pleasure to see you today!  Thank you for trusting me with your gastrointestinal care!

## 2024-04-06 ENCOUNTER — Encounter: Payer: Self-pay | Admitting: Podiatry

## 2024-04-06 ENCOUNTER — Ambulatory Visit (INDEPENDENT_AMBULATORY_CARE_PROVIDER_SITE_OTHER)

## 2024-04-06 ENCOUNTER — Ambulatory Visit: Admitting: Podiatry

## 2024-04-06 DIAGNOSIS — M722 Plantar fascial fibromatosis: Secondary | ICD-10-CM

## 2024-04-06 DIAGNOSIS — K5730 Diverticulosis of large intestine without perforation or abscess without bleeding: Secondary | ICD-10-CM | POA: Insufficient documentation

## 2024-04-06 DIAGNOSIS — M7740 Metatarsalgia, unspecified foot: Secondary | ICD-10-CM

## 2024-04-06 MED ORDER — TRIAMCINOLONE ACETONIDE 40 MG/ML IJ SUSP
20.0000 mg | Freq: Once | INTRAMUSCULAR | Status: AC
  Administered 2024-04-06: 20 mg

## 2024-04-06 MED ORDER — MELOXICAM 15 MG PO TABS: 15.0000 mg | ORAL_TABLET | Freq: Every day | ORAL | 3 refills | Status: DC

## 2024-04-06 MED ORDER — METHYLPREDNISOLONE 4 MG PO TBPK
ORAL_TABLET | ORAL | 0 refills | Status: DC
Start: 2024-04-06 — End: 2024-05-25

## 2024-04-06 NOTE — Progress Notes (Signed)
 Orthotics   Patient was present and evaluated for Custom molded foot orthotics. Patient will benefit from CFO's to provide total contact to BIL MLA's helping to balance and distribute body weight more evenly across BIL feet helping to reduce plantar pressure and pain. Orthotic will also encourage FF / RF alignment  Patient was scanned today and will return for fitting upon receipt

## 2024-04-07 NOTE — Progress Notes (Signed)
 He presents today to be seen for left heel pain.  States that I been wearing the orthotics on a daily basis but I feel that maybe they are not working as well as they were out redeveloped some heel pain and he states that he is active in tennis.  He has had these orthotics since 2017 he says.  Objective: Vital signs are stable he is alert and oriented x 3.  Pulses are palpable.  No erythema Dem salines drainage or odor he does have pain on palpation medial calcaneal tubercle.  Assessment: Flareup of his plantar fasciitis left foot.  Plan: I reinjected the left heel today started him on methylprednisolone  to be followed by meloxicam.  And he was casted today for orthotics.

## 2024-05-06 ENCOUNTER — Other Ambulatory Visit: Payer: Self-pay | Admitting: Internal Medicine

## 2024-05-06 ENCOUNTER — Other Ambulatory Visit

## 2024-05-06 ENCOUNTER — Telehealth: Payer: Self-pay | Admitting: Internal Medicine

## 2024-05-06 DIAGNOSIS — Z8711 Personal history of peptic ulcer disease: Secondary | ICD-10-CM

## 2024-05-06 DIAGNOSIS — R1013 Epigastric pain: Secondary | ICD-10-CM

## 2024-05-06 MED ORDER — PANTOPRAZOLE SODIUM 40 MG PO TBEC: 40.0000 mg | DELAYED_RELEASE_TABLET | Freq: Every day | ORAL | 0 refills | Status: DC

## 2024-05-06 NOTE — Telephone Encounter (Signed)
 Received a page to the [facility] on call pager. Patient is calling because he has developed a sour pain in the stomach along with black stools. About 2 weeks ago he was in [place]  when he developed black stools. He has a history of NSAID induced stomach ulcers back in 2013 and thus started taking OTC Prilosec 20 mg for 2 weeks. He completed this course of Prilosec 3 days ago. He then had a recurrence in his symptoms today. Denies recent NSAID use. Denies N&V. Denies use of blood thinners. He has had some increased burping. Will send Protonix  40 mg daily. Looks like he also has a history of possible EoE based upon his EGD in 2021. Will CC Dr. Albertus to this telephone note.   Pod A triage, please arrange for a clinic visit with Dr. Albertus or APP in the next 2-4 weeks for ab pain and melena.

## 2024-05-07 NOTE — Telephone Encounter (Signed)
 Pt scheduled to see Delon Failing PA 05/25/24@2 :30pm. Pt aware of appt.

## 2024-05-17 ENCOUNTER — Telehealth: Payer: Self-pay

## 2024-05-17 NOTE — Telephone Encounter (Signed)
 LVM to schedule orthotic fitting/ pu

## 2024-05-21 ENCOUNTER — Ambulatory Visit: Admitting: Physician Assistant

## 2024-05-25 ENCOUNTER — Encounter: Payer: Self-pay | Admitting: Physician Assistant

## 2024-05-25 ENCOUNTER — Ambulatory Visit: Admitting: Physician Assistant

## 2024-05-25 ENCOUNTER — Other Ambulatory Visit (INDEPENDENT_AMBULATORY_CARE_PROVIDER_SITE_OTHER)

## 2024-05-25 VITALS — BP 118/70 | HR 68 | Ht 73.0 in | Wt 230.0 lb

## 2024-05-25 DIAGNOSIS — R195 Other fecal abnormalities: Secondary | ICD-10-CM

## 2024-05-25 DIAGNOSIS — Z8719 Personal history of other diseases of the digestive system: Secondary | ICD-10-CM | POA: Diagnosis not present

## 2024-05-25 DIAGNOSIS — Z8711 Personal history of peptic ulcer disease: Secondary | ICD-10-CM | POA: Diagnosis not present

## 2024-05-25 DIAGNOSIS — R1013 Epigastric pain: Secondary | ICD-10-CM

## 2024-05-25 LAB — CBC WITH DIFFERENTIAL/PLATELET
Basophils Absolute: 0 K/uL (ref 0.0–0.1)
Basophils Relative: 0.6 % (ref 0.0–3.0)
Eosinophils Absolute: 0.5 K/uL (ref 0.0–0.7)
Eosinophils Relative: 7 % — ABNORMAL HIGH (ref 0.0–5.0)
HCT: 42.9 % (ref 39.0–52.0)
Hemoglobin: 14.5 g/dL (ref 13.0–17.0)
Lymphocytes Relative: 27 % (ref 12.0–46.0)
Lymphs Abs: 1.9 K/uL (ref 0.7–4.0)
MCHC: 33.8 g/dL (ref 30.0–36.0)
MCV: 91.1 fl (ref 78.0–100.0)
Monocytes Absolute: 0.6 K/uL (ref 0.1–1.0)
Monocytes Relative: 7.8 % (ref 3.0–12.0)
Neutro Abs: 4.1 K/uL (ref 1.4–7.7)
Neutrophils Relative %: 57.6 % (ref 43.0–77.0)
Platelets: 239 K/uL (ref 150.0–400.0)
RBC: 4.71 Mil/uL (ref 4.22–5.81)
RDW: 13.1 % (ref 11.5–15.5)
WBC: 7.2 K/uL (ref 4.0–10.5)

## 2024-05-25 LAB — COMPREHENSIVE METABOLIC PANEL WITH GFR
ALT: 19 U/L (ref 0–53)
AST: 18 U/L (ref 0–37)
Albumin: 4.4 g/dL (ref 3.5–5.2)
Alkaline Phosphatase: 73 U/L (ref 39–117)
BUN: 14 mg/dL (ref 6–23)
CO2: 27 meq/L (ref 19–32)
Calcium: 9 mg/dL (ref 8.4–10.5)
Chloride: 102 meq/L (ref 96–112)
Creatinine, Ser: 0.85 mg/dL (ref 0.40–1.50)
GFR: 102.07 mL/min (ref >60.00)
Glucose, Bld: 106 mg/dL — ABNORMAL HIGH (ref 70–99)
Potassium: 3.8 meq/L (ref 3.5–5.1)
Sodium: 137 meq/L (ref 135–145)
Total Bilirubin: 0.4 mg/dL (ref 0.2–1.2)
Total Protein: 7.1 g/dL (ref 6.0–8.3)

## 2024-05-25 LAB — IBC + FERRITIN
Ferritin: 69.1 ng/mL (ref 22.0–322.0)
Iron: 70 ug/dL (ref 42–165)
Saturation Ratios: 20.2 % (ref 20.0–50.0)
TIBC: 347.2 ug/dL (ref 250.0–450.0)
Transferrin: 248 mg/dL (ref 212.0–360.0)

## 2024-05-25 MED ORDER — PANTOPRAZOLE SODIUM 40 MG PO TBEC: 40.0000 mg | DELAYED_RELEASE_TABLET | Freq: Every day | ORAL | 1 refills | Status: DC

## 2024-05-25 NOTE — Patient Instructions (Addendum)
 Your provider has requested that you go to the basement level for lab work before leaving today. Press B on the elevator. The lab is located at the first door on the left as you exit the elevator.  We have sent the following medications to your pharmacy for you to pick up at your convenience: Pantoprazole  40 mg daily 30-60 minutes before breakfast.   You have been scheduled for an endoscopy. Please follow written instructions given to you at your visit today.  If you use inhalers (even only as needed), please bring them with you on the day of your procedure.  If you take any of the following medications, they will need to be adjusted prior to your procedure:   DO NOT TAKE 7 DAYS PRIOR TO TEST- Trulicity (dulaglutide) Ozempic, Wegovy (semaglutide) Mounjaro (tirzepatide) Bydureon Bcise (exanatide extended release)  DO NOT TAKE 1 DAY PRIOR TO YOUR TEST Rybelsus (semaglutide) Adlyxin (lixisenatide) Victoza (liraglutide) Byetta (exanatide) _____________________________________________________________________  _______________________________________________________  If your blood pressure at your visit was 140/90 or greater, please contact your primary care physician to follow up on this.  _______________________________________________________  If you are age 49 or older, your body mass index should be between 23-30. Your Body mass index is 30.34 kg/m. If this is out of the aforementioned range listed, please consider follow up with your Primary Care Provider.  If you are age 69 or younger, your body mass index should be between 19-25. Your Body mass index is 30.34 kg/m. If this is out of the aformentioned range listed, please consider follow up with your Primary Care Provider.   ________________________________________________________  The [facility] providers would like to encourage you to use MYCHART to communicate with providers for non-urgent requests or questions.  Due to  long hold times on the telephone, sending your provider a message by [facility] may be a faster and more efficient way to get a response.  Please allow 48 business hours for a response.  Please remember that this is for non-urgent requests.  _______________________________________________________  [facility] is using a team-based approach to care.  Your team is made up of your doctor and two to three APPS. Our APPS (Nurse Practitioners and Physician Assistants) work with your physician to ensure care continuity for you. They are fully qualified to address your health concerns and develop a treatment plan. They communicate directly with your gastroenterologist to care for you. Seeing the Advanced Practice Practitioners on your physician's team can help you by facilitating care more promptly, often allowing for earlier appointments, access to diagnostic testing, procedures, and other specialty referrals.

## 2024-05-25 NOTE — Progress Notes (Signed)
 Chief Complaint: Dark stools  HPI:    Larry Pratt is a 47 year old male, assigned to Dr. Albertus, who was referred to me by Avva, Ravisankar, MD for a complaint of dark stools.     05/01/2020 EGD done for esophageal stricture/dysphagia with esophageal mucosal changes consistent with eosinophilic esophagitis, benign-appearing esophageal stenosis dilated and erosive gastropathy with no bleeding.  Started on Flovent  HFA 220 mcg 2 puffs swallowed twice daily.    11/01/2021 CTAP with contrast showed findings consistent with acute diverticulitis involving the distal descending and proximal sigmoid colon.  Possible tiny focus of perforation.    03/2022 colonoscopy with external skin tags, 7 mm polyp at the ileocecal valve, mild diverticulosis in the right colon and moderate diverticulosis in the left colon.  Pathology showed a tubular adenoma.  Repeat recommended in January 2030.    03/11/2024 patient seen in clinic by Larry Blower, PA-C to reestablish care.  At that time following up for diverticulitis.  Discussed 4 episodes of uncomplicated diverticulitis since January 2023, most recent episode March 2025 treated with Augmentin  with resolution.  He was sent in a refill of Augmentin  to keep on hand.  Also discussed potential surgical referral.    05/06/2024 patient called on-call provider and describes sour pain in the stomach along with black stools.  This is started 2 weeks prior.  History of NSAID induced stomach ulcers in 2013 and started taking over-the-counter Prilosec 20 mg for 2 weeks.  He completed the course 3 days prior and had recurrence of symptoms.  Increased eructations.  Patient was sent in Protonix  40 mg daily.  Also discussed a history of possible EOE based upon his EGD in 2021.    Today, patient presents to clinic and tells me that about 10 years ago he was admitted to the hospital after having stomach pain and an EGD that showed stomach ulcers, he started on Protonix  for a while and had a  follow-up EGD that was okay.  He discontinued the Pantoprazole  after that.      About a month ago he was in [place]  on vacation and started with the same epigastric pain and started seeing some dark stools.  He went to a local store and bought some Prilosec 20 mg and took the 14 days of this in the box and after about 2 days the symptoms went away.  When he got back he had run out of this medicine and the pain started back so he called as above and was sent in Protonix  40 mg daily which he has stayed on since then.  He is currently not having any symptoms remaining on this medication.    He knows Dr. Albertus personally, apparently their kids go to school together.    Denies fever, chills, weight loss, nausea, vomiting or symptoms that awaken him from sleep.  Past Medical History:  Diagnosis Date   Diverticulitis    History of stomach ulcers    Hyperlipidemia    Plantar fasciitis     Past Surgical History:  Procedure Laterality Date   APPENDECTOMY     BIOPSY  03/31/2020   Procedure: BIOPSY;  Surgeon: Aneita Gwendlyn DASEN, MD;  Location: [facility] ENDOSCOPY;  Service: Endoscopy;;   ESOPHAGOGASTRODUODENOSCOPY (EGD) WITH PROPOFOL  N/A 03/31/2020   Procedure: ESOPHAGOGASTRODUODENOSCOPY (EGD) WITH PROPOFOL ;  Surgeon: Aneita Gwendlyn DASEN, MD;  Location: [facility] ENDOSCOPY;  Service: Endoscopy;  Laterality: N/A;   HERNIA REPAIR     IMPACTION REMOVAL  03/31/2020   Procedure: IMPACTION REMOVAL;  Surgeon:  Aneita Gwendlyn DASEN, MD;  Location: [facility] ENDOSCOPY;  Service: Endoscopy;;   UPPER GASTROINTESTINAL ENDOSCOPY      Current Outpatient Medications  Medication Sig Dispense Refill   Multiple Vitamin (MULTIVITAMIN) tablet Take 1 tablet by mouth daily.     pantoprazole  (PROTONIX ) 40 MG tablet TAKE 1 TABLET(40 MG) BY MOUTH DAILY 90 tablet 0   psyllium (METAMUCIL) 58.6 % packet Take 1 packet by mouth daily.     rosuvastatin  (CRESTOR ) 20 MG tablet Take 20 mg by mouth daily.     No current facility-administered medications for this  visit.    Allergies as of 05/25/2024   (No Known Allergies)    Family History  Problem Relation Age of Onset   Uterine cancer Mother    Cardiomyopathy Father    Breast cancer Sister    Lung cancer Paternal Grandmother    Colon cancer Neg Hx    Colon polyps Neg Hx    Esophageal cancer Neg Hx    Stomach cancer Neg Hx    Rectal cancer Neg Hx    Pancreatic cancer Neg Hx     Social History   Socioeconomic History   Marital status: Married    Spouse name: Not on file   Number of children: Not on file   Years of education: Not on file   Highest education level: Not on file  Occupational History   Not on file  Tobacco Use   Smoking status: Never   Smokeless tobacco: Never  Vaping Use   Vaping status: Never Used  Substance and Sexual Activity   Alcohol use: Yes    Alcohol/week: 2.0 standard drinks of alcohol    Types: 2 Shots of liquor per week    Comment: 5-6   Drug use: No   Sexual activity: Not on file  Other Topics Concern   Not on file  Social History Narrative   Iced tea   Social Drivers of Corporate investment banker Strain: Not on file  Food Insecurity: Not on file  Transportation Needs: Not on file  Physical Activity: Not on file  Stress: Not on file  Social Connections: Not on file  Intimate Partner Violence: Not on file    Review of Systems:    Constitutional: No weight loss, fever or chills Cardiovascular: No chest pain Respiratory: No SOB Gastrointestinal: See HPI and otherwise negative   Physical Exam:  Vital signs: BP 118/70   Pulse 68   Ht 6' 1 (1.854 m)   Wt 230 lb (104.3 kg)   BMI 30.34 kg/m    Constitutional:   Pleasant Caucasian male appears to be in NAD, Well developed, Well nourished, alert and cooperative Head:  Normocephalic and atraumatic. Eyes:   PEERL, EOMI. No icterus. Conjunctiva pink. Ears:  Normal auditory acuity. Neck:  Supple Throat: Oral cavity and pharynx without inflammation, swelling or lesion.  Respiratory:  Respirations even and unlabored. Lungs clear to auscultation bilaterally.   No wheezes, crackles, or rhonchi.  Cardiovascular: Normal S1, S2. No MRG. Regular rate and rhythm. No peripheral edema, cyanosis or pallor.  Gastrointestinal:  Soft, nondistended, nontender. No rebound or guarding. Normal bowel sounds. No appreciable masses or hepatomegaly. Rectal:  Not performed.  Msk:  Symmetrical without gross deformities. Without edema, no deformity or joint abnormality.  Neurologic:  Alert and  oriented x4;  grossly normal neurologically.  Skin:   Dry and intact without significant lesions or rashes. Psychiatric: Demonstrates good judgement and reason without abnormal affect or behaviors.  No recent labs or imaging.  Assessment: 1.  Dark stools: With below; Thurn for upper GI bleed 2.  Epigastric pain: About a month ago, better after starting Pantoprazole , but also had dark stools at the time with distant history of gastric ulcers in 2013, no history of NSAID use, no increased alcohol intake at the time; consider PUD +/- H. pylori 3.  History of EOE: On EGD in 2021, patient was given Flovent  inhaler at that time, denies any symptoms of dysphagia now  Plan: 1.  Discussed with patient given history of dark stools which likely indicate upper GI bleed would recommend that we follow-up with an EGD.  This is scheduled with Dr. Albertus in the [facility].  Did provide the patient a detailed list of risks for procedure and he agrees to proceed. Patient is appropriate for endoscopic procedure(s) in the ambulatory ([facility]) setting.  2.  For now continue Pantoprazole  40 mg daily.  Sent in a new prescription #30 with 2 refills. 3.  Ordered labs today to include a CBC, CMP and iron studies with ferritin 4.  Patient to follow in clinic per recommendations after time of procedure.  Larry Failing, PA-C [facility] 05/25/2024, 2:31 PM  Cc: Avva, Ravisankar, MD

## 2024-05-27 ENCOUNTER — Ambulatory Visit: Payer: Self-pay | Admitting: Physician Assistant

## 2024-06-03 ENCOUNTER — Ambulatory Visit (INDEPENDENT_AMBULATORY_CARE_PROVIDER_SITE_OTHER)

## 2024-06-03 NOTE — Progress Notes (Signed)
Patient presents today to pick up custom molded foot orthotics, diagnosed with PF by Dr. Al Corpus .   Orthotics were dispensed and fit was satisfactory. Reviewed instructions for break-in and wear. Written instructions given to patient.  Patient will follow up as needed.   Addison Bailey Cped, CFo, CFm

## 2024-06-17 ENCOUNTER — Encounter: Payer: Self-pay | Admitting: Internal Medicine

## 2024-06-17 ENCOUNTER — Ambulatory Visit: Admitting: Internal Medicine

## 2024-06-17 VITALS — BP 135/94 | HR 60 | Temp 97.3°F | Resp 15 | Ht 73.0 in | Wt 230.0 lb

## 2024-06-17 DIAGNOSIS — K3189 Other diseases of stomach and duodenum: Secondary | ICD-10-CM

## 2024-06-17 DIAGNOSIS — Z8711 Personal history of peptic ulcer disease: Secondary | ICD-10-CM

## 2024-06-17 DIAGNOSIS — K319 Disease of stomach and duodenum, unspecified: Secondary | ICD-10-CM | POA: Diagnosis not present

## 2024-06-17 DIAGNOSIS — K259 Gastric ulcer, unspecified as acute or chronic, without hemorrhage or perforation: Secondary | ICD-10-CM

## 2024-06-17 DIAGNOSIS — K200 Eosinophilic esophagitis: Secondary | ICD-10-CM

## 2024-06-17 DIAGNOSIS — R1013 Epigastric pain: Secondary | ICD-10-CM

## 2024-06-17 DIAGNOSIS — K253 Acute gastric ulcer without hemorrhage or perforation: Secondary | ICD-10-CM

## 2024-06-17 MED ORDER — PANTOPRAZOLE SODIUM 40 MG PO TBEC: 40.0000 mg | DELAYED_RELEASE_TABLET | Freq: Every day | ORAL | 1 refills | Status: AC

## 2024-06-17 MED ORDER — SODIUM CHLORIDE 0.9 % IV SOLN: 500.0000 mL | Freq: Once | INTRAVENOUS | Status: DC

## 2024-06-17 NOTE — Progress Notes (Signed)
 Called to room to assist during endoscopic procedure.  Patient ID and intended procedure confirmed with present staff. Received instructions for my participation in the procedure from the performing physician.

## 2024-06-17 NOTE — Progress Notes (Signed)
 Sedate, gd SR, tolerated procedure well, VSS, report to RN

## 2024-06-17 NOTE — Patient Instructions (Signed)
 Resume previous diet  Continue present medications. Continue pantoprazole  (Protonix ) 40 mg daily for 12 weeks total.  No aspirin, ibuprofen, naproxen, or other non-steroidal anti-inflammatory drugs for 8 weeks  Await pathology results  If NSAIDs are used in the future, use full dose PPI with NSAIDs.  YOU HAD AN ENDOSCOPIC PROCEDURE TODAY AT THE [facility]:   Refer to the procedure report that was given to you for any specific questions about what was found during the examination.  If the procedure report does not answer your questions, please call your gastroenterologist to clarify.  If you requested that your care partner not be given the details of your procedure findings, then the procedure report has been included in a sealed envelope for you to review at your convenience later.  YOU SHOULD EXPECT: Some feelings of bloating in the abdomen. Passage of more gas than usual.  Walking can help get rid of the air that was put into your GI tract during the procedure and reduce the bloating. If you had a lower endoscopy (such as a colonoscopy or flexible sigmoidoscopy) you may notice spotting of blood in your stool or on the toilet paper. If you underwent a bowel prep for your procedure, you may not have a normal bowel movement for a few days.  Please Note:  You might notice some irritation and congestion in your nose or some drainage.  This is from the oxygen used during your procedure.  There is no need for concern and it should clear up in a day or so.  SYMPTOMS TO REPORT IMMEDIATELY: Following upper endoscopy (EGD)  Vomiting of blood or coffee ground material  New chest pain or pain under the shoulder blades  Painful or persistently difficult swallowing  New shortness of breath  Fever of 100F or higher  Black, tarry-looking stools  For urgent or emergent issues, a gastroenterologist can be reached at any hour by calling [phone]. Do not use MyChart messaging for urgent  concerns.   DIET:  We do recommend a small meal at first, but then you may proceed to your regular diet.  Drink plenty of fluids but you should avoid alcoholic beverages for 24 hours.  ACTIVITY:  You should plan to take it easy for the rest of today and you should NOT DRIVE or use heavy machinery until tomorrow (because of the sedation medicines used during the test).    FOLLOW UP: Our staff will call the number listed on your records the next business day following your procedure.  We will call around 7:15- 8:00 am to check on you and address any questions or concerns that you may have regarding the information given to you following your procedure. If we do not reach you, we will leave a message.     If any biopsies were taken you will be contacted by phone or by letter within the next 1-3 weeks.  Please call us  at [phone] if you have not heard about the biopsies in 3 weeks.   SIGNATURES/CONFIDENTIALITY: You and/or your care partner have signed paperwork which will be entered into your electronic medical record.  These signatures attest to the fact that that the information above on your After Visit Summary has been reviewed and is understood.  Full responsibility of the confidentiality of this discharge information lies with you and/or your care-partner.

## 2024-06-17 NOTE — Progress Notes (Signed)
 See office note dated 05/25/2024 for details of current H&P  Patient presenting to follow-up epigastric pain and dark stool Also has history of GERD, EoE and peptic ulcer disease  Currently on pantoprazole  40 mg daily  He remains appropriate for upper endoscopy in the [facility]

## 2024-06-17 NOTE — Op Note (Signed)
 [facility] Patient Name: Larry Pratt Procedure Date: 06/17/2024 4:06 PM MRN: [id number] Endoscopist: Gordy CHRISTELLA Starch , MD, [id number] Age: 48 Referring MD:  Date of Birth: 08/15/1976 Gender: Male Account #: [id number] Procedure:                Upper GI endoscopy Indications:              Epigastric abdominal pain associated with dark                            stools in association with meloxicam  use,                            pantoprazole  has alleviated symptoms, hx of PUD in                            2013 (H. Pylori neg at that time), hx of EoE                            without current esophageal symptoms Medicines:                Monitored Anesthesia Care Procedure:                Pre-Anesthesia Assessment:                           - Prior to the procedure, a History and Physical                            was performed, and patient medications and                            allergies were reviewed. The patient's tolerance of                            previous anesthesia was also reviewed. The risks                            and benefits of the procedure and the sedation                            options and risks were discussed with the patient.                            All questions were answered, and informed consent                            was obtained. Prior Anticoagulants: The patient has                            taken no anticoagulant or antiplatelet agents. ASA                            Grade Assessment: II - A patient with mild systemic  disease. After reviewing the risks and benefits,                            the patient was deemed in satisfactory condition to                            undergo the procedure.                           After obtaining informed consent, the endoscope was                            passed under direct vision. Throughout the                            procedure, the patient's blood  pressure, pulse, and                            oxygen saturations were monitored continuously. The                            Olympus Scope [id number] was introduced through the                            mouth, and advanced to the second part of duodenum.                            The upper GI endoscopy was accomplished without                            difficulty. The patient tolerated the procedure                            well. Scope In: Scope Out: Findings:                 Mucosal changes including longitudinal furrows,                            circumferential folds and congestion (edema) were                            found in the middle third of the esophagus and in                            the lower third of the esophagus. Esophageal                            findings were graded using the Eosinophilic                            Esophagitis Endoscopic Reference Score (EoE-EREFS)                            as: Edema Grade 1 Present (decreased clarity  or                            absence of vascular markings), Rings Grade 1 Mild                            (subtle circumferential ridges seen on esophageal                            distension), Exudates Grade 0 None (no white                            lesions seen), Furrows Grade 1 Mild (vertical lines                            without visible depth) and Stricture none (no                            stricture found). Biopsies were obtained from the                            proximal and distal esophagus with cold forceps for                            histology of suspected eosinophilic esophagitis.                           One non-bleeding cratered gastric ulcer with no                            stigmata of bleeding was found in the gastric                            antrum. The lesion was 6 mm in largest dimension.                            Biopsies were taken with a cold forceps for                             Helicobacter pylori testing.                           The cardia and gastric fundus were normal on                            retroflexion.                           The examined duodenum was normal. Complications:            No immediate complications. Estimated Blood Loss:     Estimated blood loss was minimal. Impression:               - Esophageal mucosal changes consistent with  eosinophilic esophagitis. No esophageal stricture                            seen. Biopsies were taken with a cold forceps for                            evaluation of eosinophilic esophagitis.                           - Non-bleeding gastric ulcer with no stigmata of                            bleeding. Biopsied to exclude H. Pylori infection                            (though most likely associated with                            NSAID/meloxicam ).                           - Normal examined duodenum. Recommendation:           - Patient has a contact number available for                            emergencies. The signs and symptoms of potential                            delayed complications were discussed with the                            patient. Return to normal activities tomorrow.                            Written discharge instructions were provided to the                            patient.                           - Resume previous diet.                           - Continue present medications. Continue                            pantoprazole  40 mg daily for 12 weeks total.                           - No aspirin, ibuprofen, naproxen, or other                            non-steroidal anti-inflammatory drugs x 8 weeks.                           -  Await pathology results.                           - If NSAIDs are used in the future full dose PPI                            should be used in conjunction with NSAIDs for                            gastric protection. Gordy CHRISTELLA Starch, MD 06/17/2024 4:26:32 PM This report has been signed electronically.

## 2024-06-18 ENCOUNTER — Telehealth: Payer: Self-pay | Admitting: *Deleted

## 2024-06-18 NOTE — Telephone Encounter (Signed)
 No answer after post call. Left a message.

## 2024-06-23 ENCOUNTER — Encounter: Payer: Self-pay | Admitting: Internal Medicine

## 2024-06-23 ENCOUNTER — Ambulatory Visit: Payer: Self-pay | Admitting: Internal Medicine

## 2024-06-23 DIAGNOSIS — K200 Eosinophilic esophagitis: Secondary | ICD-10-CM | POA: Insufficient documentation

## 2024-06-23 LAB — SURGICAL PATHOLOGY

## 2024-08-25 ENCOUNTER — Encounter: Payer: Self-pay | Admitting: Podiatry

## 2024-08-25 ENCOUNTER — Ambulatory Visit: Admitting: Podiatry

## 2024-08-25 DIAGNOSIS — M722 Plantar fascial fibromatosis: Secondary | ICD-10-CM

## 2024-08-25 MED ORDER — TRIAMCINOLONE ACETONIDE 40 MG/ML IJ SUSP
40.0000 mg | Freq: Once | INTRAMUSCULAR | Status: AC
  Administered 2024-08-25: 40 mg

## 2024-08-25 NOTE — Progress Notes (Signed)
 Patient resents complaint of heel pain on the left heel.  I have been doing better and he was in [place]  doing a lot of hiking.  Flared up again.  He has orthotics which he has been using.  He cannot take meloxicam  because of insensitivity to it with GI issues   Physical exam:  General appearance: Pleasant, and in no acute distress. AOx3.  Vascular: Pedal pulses: DP 2/4 bilaterally, PT 2/4 bilaterally.  Minimal edema lower legs bilaterally. Capillary fill time immediate bilaterally.  Neurological: Light touch intact feet bilaterally.  Normal Achilles reflex bilaterally.  No clonus or spasticity noted.  Negative Tinel sign tarsal tunnel and porta pedis left  Dermatologic:   Skin normal temperature bilaterally.  Skin normal color, tone, and texture bilaterally.   Musculoskeletal: Tenderness plantar medial calcaneal tubercle left.  No tenderness lateral compression of calcaneus.  No posterior heel pain.    Diagnosis: 1.  Plantar fasciitis left  Plan: -Discussed icing and stretching.  Recommend injection today. -injected 3cc 2:1 mixture 0.5 cc Marcaine:Kenolog 10mg /41ml at medial origin plantar fascia at medial calcaneal plantar tubercle left.      Return weeks follow-up injection plantar fascia left

## 2024-09-07 ENCOUNTER — Encounter: Payer: Self-pay | Admitting: Internal Medicine

## 2024-09-09 ENCOUNTER — Ambulatory Visit: Admitting: Podiatry

## 2024-09-09 DIAGNOSIS — M722 Plantar fascial fibromatosis: Secondary | ICD-10-CM | POA: Diagnosis not present

## 2024-09-09 MED ORDER — TRIAMCINOLONE ACETONIDE 40 MG/ML IJ SUSP
20.0000 mg | Freq: Once | INTRAMUSCULAR | Status: AC
  Administered 2024-09-09: 20 mg

## 2024-09-09 MED ORDER — METHYLPREDNISOLONE 4 MG PO TBPK: ORAL_TABLET | ORAL | 0 refills | Status: AC

## 2024-09-09 NOTE — Progress Notes (Signed)
 Larry Pratt presents today states that shot helped he still has a little bit of pain he still active in tennis that makes it worse after the games but all in all he seems to be doing better.  He states that the meloxicam  gave him a gastric ulcer so he can no longer take NSAIDs.  Objective: Vital signs are stable alert oriented x 3.  There is no erythema edema cellulitis drainage or has pain on palpation medial calcaneal tubercle of the left heel.  Assessment: Plantar fasciitis residual left.  Plan: Continue the use of his orthotics that he likes started him on methylprednisolone  reinjected the left heel today 20 mg Kenalog  pentagrams Marcaine to the point of maximal tenderness.  Once again discussed appropriate shoe gear with him.  I will follow-up with him in a month if necessary.  If this is not improved in a month may need to consider MRI

## 2024-10-07 ENCOUNTER — Ambulatory Visit: Admitting: Podiatry

## 2024-10-07 ENCOUNTER — Encounter: Payer: Self-pay | Admitting: Gastroenterology

## 2024-10-07 ENCOUNTER — Ambulatory Visit: Admitting: Gastroenterology

## 2024-10-07 VITALS — BP 128/72 | HR 79 | Ht 73.0 in | Wt 228.0 lb

## 2024-10-07 DIAGNOSIS — Z8711 Personal history of peptic ulcer disease: Secondary | ICD-10-CM

## 2024-10-07 DIAGNOSIS — K200 Eosinophilic esophagitis: Secondary | ICD-10-CM

## 2024-10-07 DIAGNOSIS — R1013 Epigastric pain: Secondary | ICD-10-CM

## 2024-10-07 DIAGNOSIS — K5792 Diverticulitis of intestine, part unspecified, without perforation or abscess without bleeding: Secondary | ICD-10-CM

## 2024-10-07 DIAGNOSIS — K253 Acute gastric ulcer without hemorrhage or perforation: Secondary | ICD-10-CM

## 2024-10-07 DIAGNOSIS — D120 Benign neoplasm of cecum: Secondary | ICD-10-CM

## 2024-10-07 MED ORDER — AMOXICILLIN-POT CLAVULANATE 875-125 MG PO TABS: 1.0000 | ORAL_TABLET | Freq: Two times a day (BID) | ORAL | 1 refills | Status: AC

## 2024-10-07 NOTE — Patient Instructions (Addendum)
 _______________________________________________________  If your blood pressure at your visit was 140/90 or greater, please contact your primary care physician to follow up on this.  _______________________________________________________  If you are age 49 or older, your body mass index should be between 23-30. Your Body mass index is 30.08 kg/m. If this is out of the aforementioned range listed, please consider follow up with your Primary Care Provider.  If you are age 62 or younger, your body mass index should be between 19-25. Your Body mass index is 30.08 kg/m. If this is out of the aformentioned range listed, please consider follow up with your Primary Care Provider.   ________________________________________________________  The [facility] providers would like to encourage you to use MYCHART to communicate with providers for non-urgent requests or questions.  Due to long hold times on the telephone, sending your provider a message by [facility] may be a faster and more efficient way to get a response.  Please allow 48 business hours for a response.  Please remember that this is for non-urgent requests.  _______________________________________________________  [facility] is using a team-based approach to care.  Your team is made up of your doctor and two to three APPS. Our APPS (Nurse Practitioners and Physician Assistants) work with your physician to ensure care continuity for you. They are fully qualified to address your health concerns and develop a treatment plan. They communicate directly with your gastroenterologist to care for you. Seeing the Advanced Practice Practitioners on your physician's team can help you by facilitating care more promptly, often allowing for earlier appointments, access to diagnostic testing, procedures, and other specialty referrals.   We have sent the following medications to your pharmacy for you to pick up at your convenience Augmentin  875-125mg   one tablet two times daily for 14 days

## 2024-10-07 NOTE — Progress Notes (Signed)
 Chief Complaint: Diverticulitis flare Primary GI MD: Dr. Albertus  HPI: Discussed the use of AI scribe software for clinical note transcription with the patient, who gave verbal consent to proceed.  History of Present Illness  Larry Pratt is a 48 year old male with recurrent diverticulitis who presents for follow-up of diverticulitis management.  He has a history of recurrent diverticulitis, with the most recent episode occurring in September while in [place], [place] . This marks the fifth occurrence since January 2023, with previous episodes in March and four episodes in 2023. He notes that the condition seems to be becoming more frequent, with episodes approximately every six months. During the recent trip, he experienced a 'little twinge' that worsened throughout the day, prompting him to start antibiotics.  He has a history of a gastric ulcer diagnosed in August after experiencing dark stools. An endoscopy confirmed the presence of the ulcer, and he was treated with an antacid for twelve weeks. He stopped the medication after becoming symptom-free and has been doing well since then.  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 His wife is the catering manager.    PREVIOUS GI WORKUP   EGD 05/2024 for melena - Esophageal mucosal changes consistent with eosinophilic esophagitis. No esophageal stricture seen. Biopsies were taken with a cold forceps for evaluation of eosinophilic esophagitis.  - Non- bleeding gastric ulcer with no stigmata of bleeding. Biopsied to exclude H. Pylori infection ( though most likely associated with NSAID/ meloxicam ) . - Normal examined duodenum.  1. Surgical [P], gastric ulcer :      - REACTIVE GASTROPATHY, INVOLVING ANTRAL MUCOSA.      - GASTRIC OXYNTIC MUCOSA WITH NO SPECIFIC PATHOLOGIC DIAGNOSIS.      - NEGATIVE FOR AN INFLAMMATORY PATTERN PREDICTIVE OF HELICOBACTER PYLORI       INFECTION.      - NEGATIVE FOR INTESTINAL METAPLASIA AND MALIGNANCY.       2. Surgical [P], distal esophagus :      - EOSINOPHILIC ESOPHAGITIS PATTERN (UP TO 30 INTRASQUAMOUS      EOSINOPHILS/HIGH-POWER FIELD).      - NEGATIVE FOR SQUAMOUS DYSPLASIA AND MALIGNANCY.      - FRAGMENT OF GASTRIC OXYNTIC MUCOSA, NEGATIVE FOR INTESTINAL METAPLASIA.   Colonoscopy June 2023: External skin tags 7 mm polyp at ileocecal valve, removed with a cold snare and retrieved (tubular adenoma) Mild diverticulosis in the right colon Moderate diverticulosis in the left colon Otherwise normal exam on direct and retroflexed views  Past Medical History:  Diagnosis Date   Diverticulitis    History of stomach ulcers    Hyperlipidemia    Plantar fasciitis     Past Surgical History:  Procedure Laterality Date   APPENDECTOMY     BIOPSY  03/31/2020   Procedure: BIOPSY;  Surgeon: Aneita Gwendlyn DASEN, MD;  Location: [facility] ENDOSCOPY;  Service: Endoscopy;;   COLONOSCOPY     ESOPHAGOGASTRODUODENOSCOPY (EGD) WITH PROPOFOL  N/A 03/31/2020   Procedure: ESOPHAGOGASTRODUODENOSCOPY (EGD) WITH PROPOFOL ;  Surgeon: Aneita Gwendlyn DASEN, MD;  Location: [facility] ENDOSCOPY;  Service: Endoscopy;  Laterality: N/A;   HERNIA REPAIR     IMPACTION REMOVAL  03/31/2020   Procedure: IMPACTION REMOVAL;  Surgeon: Aneita Gwendlyn DASEN, MD;  Location: [facility] ENDOSCOPY;  Service: Endoscopy;;   UPPER GASTROINTESTINAL ENDOSCOPY      Current Outpatient Medications  Medication Sig Dispense Refill   amoxicillin -clavulanate (AUGMENTIN ) 875-125 MG tablet Take 1 tablet by mouth 2 (two)  times daily for 14 days. 28 tablet 1   Multiple Vitamin (MULTIVITAMIN) tablet Take 1 tablet by mouth daily.     psyllium (METAMUCIL) 58.6 % packet Take 1 packet by mouth daily.     rosuvastatin  (CRESTOR ) 20 MG tablet Take 20 mg by mouth daily.     methylPREDNISolone  (MEDROL  DOSEPAK) 4 MG TBPK tablet 6 day dose pack - take as directed (Patient not taking: Reported on 10/07/2024) 21  tablet 0   pantoprazole  (PROTONIX ) 40 MG tablet Take 1 tablet (40 mg total) by mouth daily. (Patient not taking: Reported on 10/07/2024) 90 tablet 1   No current facility-administered medications for this visit.    Allergies as of 10/07/2024   (No Known Allergies)    Family History  Problem Relation Age of Onset   Uterine cancer Mother    Cardiomyopathy Father    Breast cancer Sister    Lung cancer Paternal Grandmother    Colon cancer Neg Hx    Colon polyps Neg Hx    Esophageal cancer Neg Hx    Stomach cancer Neg Hx    Rectal cancer Neg Hx    Pancreatic cancer Neg Hx     Social History   Socioeconomic History   Marital status: Married    Spouse name: Alfonso   Number of children: 2   Years of education: Not on file   Highest education level: Not on file  Occupational History   Not on file  Tobacco Use   Smoking status: Never   Smokeless tobacco: Never  Vaping Use   Vaping status: Never Used  Substance and Sexual Activity   Alcohol use: Yes    Comment: socially   Drug use: Never   Sexual activity: Not on file  Other Topics Concern   Not on file  Social History Narrative   Iced tea   Social Drivers of Health   Tobacco Use: Low Risk (08/25/2024)   Patient History    Smoking Tobacco Use: Never    Smokeless Tobacco Use: Never    Passive Exposure: Not on file  Financial Resource Strain: Not on file  Food Insecurity: Not on file  Transportation Needs: Not on file  Physical Activity: Not on file  Stress: Not on file  Social Connections: Not on file  Intimate Partner Violence: Not on file  Depression (PHQ2-9): Not on file  Alcohol Screen: Not on file  Housing: Not on file  Utilities: Not on file  Health Literacy: Not on file    Review of Systems:    Constitutional: No weight loss, fever, chills, weakness or fatigue HEENT: Eyes: No change in vision               Ears, Nose, Throat:  No change in hearing or congestion Skin: No rash or  itching Cardiovascular: No chest pain, chest pressure or palpitations   Respiratory: No SOB or cough Gastrointestinal: See HPI and otherwise negative Genitourinary: No dysuria or change in urinary frequency Neurological: No headache, dizziness or syncope Musculoskeletal: No new muscle or joint pain Hematologic: No bleeding or bruising Psychiatric: No history of depression or anxiety    Physical Exam:  Vital signs: BP 128/72   Pulse 79   Ht 6' 1 (1.854 m)   Wt 228 lb (103.4 kg)   BMI 30.08 kg/m   Constitutional: NAD, alert and cooperative Head:  Normocephalic and atraumatic. Eyes:   PEERL, EOMI. No icterus. Conjunctiva pink. Respiratory: Respirations even and unlabored. Lungs clear to auscultation bilaterally.  No wheezes, crackles, or rhonchi.  Cardiovascular:  Regular rate and rhythm. No peripheral edema, cyanosis or pallor.  Gastrointestinal:  Soft, nondistended, nontender. No rebound or guarding. Normal bowel sounds. No appreciable masses or hepatomegaly. Rectal:  Declines Msk:  Symmetrical without gross deformities. Without edema, no deformity or joint abnormality.  Neurologic:  Alert and  oriented x4;  grossly normal neurologically.  Skin:   Dry and intact without significant lesions or rashes. Psychiatric: Oriented to person, place and time. Demonstrates good judgement and reason without abnormal affect or behaviors.  Physical Exam    RELEVANT LABS AND IMAGING: CBC    Component Value Date/Time   WBC 7.2 05/25/2024 1510   RBC 4.71 05/25/2024 1510   HGB 14.5 05/25/2024 1510   HCT 42.9 05/25/2024 1510   PLT 239.0 05/25/2024 1510   MCV 91.1 05/25/2024 1510   MCH 30.8 11/04/2021 0144   MCHC 33.8 05/25/2024 1510   RDW 13.1 05/25/2024 1510   LYMPHSABS 1.9 05/25/2024 1510   MONOABS 0.6 05/25/2024 1510   EOSABS 0.5 05/25/2024 1510   BASOSABS 0.0 05/25/2024 1510    CMP     Component Value Date/Time   NA 137 05/25/2024 1510   K 3.8 05/25/2024 1510   CL 102  05/25/2024 1510   CO2 27 05/25/2024 1510   GLUCOSE 106 (H) 05/25/2024 1510   BUN 14 05/25/2024 1510   CREATININE 0.85 05/25/2024 1510   CALCIUM  9.0 05/25/2024 1510   PROT 7.1 05/25/2024 1510   ALBUMIN 4.4 05/25/2024 1510   AST 18 05/25/2024 1510   ALT 19 05/25/2024 1510   ALKPHOS 73 05/25/2024 1510   BILITOT 0.4 05/25/2024 1510   GFRNONAA >60 11/04/2021 0144   GFRAA >90 03/17/2012 1334     Assessment/Plan:   History of diverticulitis 4 episodes of uncomplicated diverticulitis since January 2023.  Most recent episode September 2025 treated with Augmentin  with resolution (March 2025 before that).Discussed colorectal surgical referral for consideration of segmental colectomy if recurrent episodes persist.  Patient previously declined but due to persistent diverticular flares becoming more progressive he is more open to conversation - Advised if recurrent diverticulitis symptoms to let us  know - Will send in Augmentin  refill for him to keep on hand for his upcoming travel if needed.  He is advised to call our office again if he has to use it - Continue high-fiber diet - He will go home and talk with his wife about possible [facility] referral for persistent diverticulitis symptoms.  He will let us  know.  EOE EGD 05/2024 removal never did show EOE, patient was not symptomatic and there was no obvious stricture.  He was treated with 12 weeks PPI. - If you are having troublesome heartburn, trouble swallowing, indigestion or any other upper GI symptoms please let me know.   History of gastric ulcers EGD 05/2024 with NSAID induced gastric ulcer, H. pylori negative.  Treated with 12 weeks twice daily PPI.  No GERD at this time and no recurrent melena - If recurrent symptoms let us  know - Continue to avoid NSAIDs  Surveillance colonoscopy Colonoscopy June 2023 with 7 mm TA at IC valve, diverticulosis. - repeat June 2030    Nestor Mollie RIGGERS [facility] 10/07/2024, 11:39  AM  Cc: Avva, Ravisankar, MD

## 2024-10-26 ENCOUNTER — Ambulatory Visit: Admitting: Podiatry

## 2024-10-26 DIAGNOSIS — S93692D Other sprain of left foot, subsequent encounter: Secondary | ICD-10-CM | POA: Diagnosis not present

## 2024-10-26 NOTE — Progress Notes (Signed)
 Larry Pratt presents today states that has not been any improvement he says if anything is seems to be getting worse.  He states that after playing a round of tennis it is exquisitely painful the next day can hardly walk on it.  Objective: Vital signs are stable alert and oriented x 3.  Pulses are palpable.  There is no erythema edema salines drainage odor he does appear to have much more of a loose not as palpable medial band of the plantar fascia on his left foot as he did before.  Assessment: Probable tear or rupture of the plantar fascia left.  Plan: Requesting MRI for evaluation of the left lower extremity discussed the possible need for physical therapy or surgery.

## 2024-11-01 NOTE — Addendum Note (Signed)
 Addended by: ELAYNE KNEE E on: 11/01/2024 09:30 AM   Modules accepted: Orders

## 2024-11-05 ENCOUNTER — Ambulatory Visit
Admission: RE | Admit: 2024-11-05 | Discharge: 2024-11-05 | Disposition: A | Discharge status: Home / Self Care | Source: Ambulatory Visit | Attending: Podiatry | Admitting: Podiatry

## 2024-11-05 DIAGNOSIS — S93692D Other sprain of left foot, subsequent encounter: Secondary | ICD-10-CM

## 2024-11-09 ENCOUNTER — Ambulatory Visit: Payer: Self-pay | Admitting: Podiatry

## 2024-11-18 ENCOUNTER — Other Ambulatory Visit: Payer: Self-pay | Admitting: General Surgery

## 2024-11-18 DIAGNOSIS — K5792 Diverticulitis of intestine, part unspecified, without perforation or abscess without bleeding: Secondary | ICD-10-CM

## 2024-11-24 ENCOUNTER — Inpatient Hospital Stay
Admission: RE | Admit: 2024-11-24 | Discharge: 2024-11-24 | Disposition: A | Source: Ambulatory Visit | Attending: General Surgery | Admitting: General Surgery

## 2024-11-24 DIAGNOSIS — K5792 Diverticulitis of intestine, part unspecified, without perforation or abscess without bleeding: Secondary | ICD-10-CM

## 2024-11-24 MED ORDER — IOPAMIDOL (ISOVUE-300) INJECTION 61%
100.0000 mL | Freq: Once | INTRAVENOUS | Status: AC | PRN
  Administered 2024-11-24: 100 mL via INTRAVENOUS
